# Patient Record
Sex: Male | Born: 1970 | Race: White | Hispanic: No | Marital: Married | Smoking: Current some day smoker
Health system: Southern US, Community
[De-identification: ages and names within clinical notes are randomized; demographics above are authoritative.]

## PROBLEM LIST (undated history)

## (undated) DIAGNOSIS — E785 Hyperlipidemia, unspecified: Secondary | ICD-10-CM

## (undated) DIAGNOSIS — I2510 Atherosclerotic heart disease of native coronary artery without angina pectoris: Secondary | ICD-10-CM

## (undated) DIAGNOSIS — I219 Acute myocardial infarction, unspecified: Secondary | ICD-10-CM

## (undated) DIAGNOSIS — I10 Essential (primary) hypertension: Secondary | ICD-10-CM

## (undated) DIAGNOSIS — G4730 Sleep apnea, unspecified: Secondary | ICD-10-CM

## (undated) DIAGNOSIS — K7689 Other specified diseases of liver: Secondary | ICD-10-CM

## (undated) HISTORY — DX: Hyperlipidemia, unspecified: E78.5

## (undated) HISTORY — DX: Other specified diseases of liver: K76.89

## (undated) HISTORY — PX: CARDIAC CATHETERIZATION: SHX172

## (undated) HISTORY — DX: Atherosclerotic heart disease of native coronary artery without angina pectoris: I25.10

## (undated) HISTORY — PX: ANGIOPLASTY: SHX39

## (undated) HISTORY — DX: Essential (primary) hypertension: I10

## (undated) HISTORY — PX: CORONARY ANGIOPLASTY: SHX604

## (undated) HISTORY — DX: Acute myocardial infarction, unspecified: I21.9

---

## 2003-03-05 ENCOUNTER — Emergency Department (HOSPITAL_COMMUNITY): Admission: EM | Admit: 2003-03-05 | Discharge: 2003-03-05 | Payer: Self-pay | Admitting: Emergency Medicine

## 2003-03-05 ENCOUNTER — Encounter: Payer: Self-pay | Admitting: *Deleted

## 2003-07-15 ENCOUNTER — Emergency Department (HOSPITAL_COMMUNITY): Admission: EM | Admit: 2003-07-15 | Discharge: 2003-07-15 | Payer: Self-pay | Admitting: Emergency Medicine

## 2003-12-25 ENCOUNTER — Emergency Department (HOSPITAL_COMMUNITY): Admission: EM | Admit: 2003-12-25 | Discharge: 2003-12-25 | Payer: Self-pay | Admitting: Emergency Medicine

## 2004-05-21 ENCOUNTER — Emergency Department (HOSPITAL_COMMUNITY): Admission: EM | Admit: 2004-05-21 | Discharge: 2004-05-21 | Payer: Self-pay

## 2004-05-21 ENCOUNTER — Emergency Department (HOSPITAL_COMMUNITY): Admission: EM | Admit: 2004-05-21 | Discharge: 2004-05-21 | Payer: Self-pay | Admitting: Emergency Medicine

## 2004-11-29 ENCOUNTER — Ambulatory Visit: Payer: Self-pay | Admitting: Family Medicine

## 2006-04-01 ENCOUNTER — Emergency Department: Payer: Self-pay | Admitting: Emergency Medicine

## 2006-11-06 ENCOUNTER — Ambulatory Visit: Payer: Self-pay | Admitting: Family Medicine

## 2008-07-01 ENCOUNTER — Emergency Department (HOSPITAL_COMMUNITY): Admission: EM | Admit: 2008-07-01 | Discharge: 2008-07-01 | Payer: Self-pay | Admitting: Emergency Medicine

## 2008-10-13 ENCOUNTER — Ambulatory Visit (HOSPITAL_BASED_OUTPATIENT_CLINIC_OR_DEPARTMENT_OTHER): Admission: RE | Admit: 2008-10-13 | Discharge: 2008-10-13 | Payer: Self-pay | Admitting: Otolaryngology

## 2008-10-18 ENCOUNTER — Ambulatory Visit: Payer: Self-pay | Admitting: Internal Medicine

## 2011-03-29 NOTE — Procedures (Signed)
NAMENARAYAN, SCULL NO.:  [id number]   MEDICAL RECORD NO.:  [id number]          PATIENT TYPE:  OUT   LOCATION:  SLEEP CENTER                 FACILITY:  [facility]   PHYSICIAN:  Taliesin D. Maple Hudson, MD, FCCP, FACPDATE OF BIRTH:  11/18/72   DATE OF STUDY:  10/13/2008                            NOCTURNAL POLYSOMNOGRAM   REFERRING PHYSICIAN:  Hermelinda Medicus, M.D.   REFERRING PHYSICIAN:  Hermelinda Medicus, M.D.   INDICATION FOR STUDY:  Hypersomnia with sleep apnea.   EPWORTH SLEEPINESS SCORE:  Epworth sleepiness score 17/24.  BMI 32.7.  Weight 215 pounds.  Height 68 inches.  Neck 14.5 inches.   MEDICATIONS:  No home medications are listed.   SLEEP ARCHITECTURE:  Total sleep time 369 minutes with sleep efficiency  90.8%.  Stage I was 6.2%.  Stage II 78.9%.  Stage III absent.  REM 14.9%  of total sleep time.  Sleep latency 25 minutes.  REM latency 114  minutes.  Awake after sleep onset 13 minutes.  Arousal index 16.7.  No  bedtime medication was taken.   RESPIRATORY DATA:  Apnea-hypopnea index (AHI) 7.5 per hour.  A total of  46 events was scored, all hypopneas.  Events were more common while  supine, but also present while side sleeping.  REM AHI 27.3 per hour.  This was a diagnostic NPSG protocol with no CPAP titration.   OXYGEN DATA:  Moderately loud snoring with oxygen desaturation to a  nadir of 81%.  Mean oxygen saturation through the study 94.2% on room  air.   CARDIAC DATA:  Normal sinus rhythm with occasional PACs.   MOVEMENT-PARASOMNIA:  No significant movement disturbance.  No rest room  trips.   IMPRESSIONS-RECOMMENDATIONS:  Mild obstructive sleep apnea/hypopnea  syndrome, AHI 7.5 per hour.  Events were more common while supine.  Moderately loud snoring with oxygen desaturation to a nadir of 81%.     Ericson D. Maple Hudson, MD, [facility], FACP  Diplomate, Biomedical engineer of Sleep Medicine  Electronically Signed    CDY/MEDQ  D:  10/18/2008 10:36:41  T:   10/19/2008 00:58:40  Job:  [id number]

## 2017-12-25 DIAGNOSIS — I10 Essential (primary) hypertension: Secondary | ICD-10-CM | POA: Insufficient documentation

## 2018-12-14 ENCOUNTER — Other Ambulatory Visit: Payer: Self-pay | Admitting: Internal Medicine

## 2018-12-14 DIAGNOSIS — I129 Hypertensive chronic kidney disease with stage 1 through stage 4 chronic kidney disease, or unspecified chronic kidney disease: Secondary | ICD-10-CM

## 2018-12-19 ENCOUNTER — Ambulatory Visit
Admission: RE | Admit: 2018-12-19 | Discharge: 2018-12-19 | Disposition: A | Discharge status: Home / Self Care | Payer: BLUE CROSS/BLUE SHIELD | Source: Ambulatory Visit | Attending: Internal Medicine | Admitting: Internal Medicine

## 2018-12-19 DIAGNOSIS — I129 Hypertensive chronic kidney disease with stage 1 through stage 4 chronic kidney disease, or unspecified chronic kidney disease: Secondary | ICD-10-CM

## 2018-12-26 ENCOUNTER — Other Ambulatory Visit: Payer: Self-pay | Admitting: Internal Medicine

## 2018-12-26 DIAGNOSIS — R160 Hepatomegaly, not elsewhere classified: Secondary | ICD-10-CM

## 2018-12-28 ENCOUNTER — Other Ambulatory Visit: Payer: Self-pay | Admitting: Internal Medicine

## 2018-12-28 DIAGNOSIS — T1590XA Foreign body on external eye, part unspecified, unspecified eye, initial encounter: Secondary | ICD-10-CM

## 2019-01-09 ENCOUNTER — Ambulatory Visit
Admission: RE | Admit: 2019-01-09 | Discharge: 2019-01-09 | Disposition: A | Discharge status: Home / Self Care | Payer: BLUE CROSS/BLUE SHIELD | Source: Ambulatory Visit | Attending: Internal Medicine | Admitting: Internal Medicine

## 2019-01-09 DIAGNOSIS — R160 Hepatomegaly, not elsewhere classified: Secondary | ICD-10-CM

## 2019-01-09 DIAGNOSIS — T1590XA Foreign body on external eye, part unspecified, unspecified eye, initial encounter: Secondary | ICD-10-CM

## 2019-01-09 MED ORDER — GADOBENATE DIMEGLUMINE 529 MG/ML IV SOLN
20.0000 mL | Freq: Once | INTRAVENOUS | Status: AC | PRN
  Administered 2019-01-09: 20 mL via INTRAVENOUS

## 2019-01-30 DIAGNOSIS — I25118 Atherosclerotic heart disease of native coronary artery with other forms of angina pectoris: Secondary | ICD-10-CM | POA: Insufficient documentation

## 2019-01-30 DIAGNOSIS — I2510 Atherosclerotic heart disease of native coronary artery without angina pectoris: Secondary | ICD-10-CM | POA: Insufficient documentation

## 2019-04-02 ENCOUNTER — Other Ambulatory Visit: Payer: Self-pay | Admitting: Internal Medicine

## 2019-04-02 DIAGNOSIS — K7689 Other specified diseases of liver: Secondary | ICD-10-CM

## 2019-04-03 ENCOUNTER — Ambulatory Visit
Admission: RE | Admit: 2019-04-03 | Discharge: 2019-04-03 | Disposition: A | Discharge status: Home / Self Care | Payer: BLUE CROSS/BLUE SHIELD | Source: Ambulatory Visit | Attending: Internal Medicine | Admitting: Internal Medicine

## 2019-04-03 DIAGNOSIS — K7689 Other specified diseases of liver: Secondary | ICD-10-CM

## 2019-04-25 ENCOUNTER — Encounter: Payer: Self-pay | Admitting: Cardiology

## 2019-04-26 ENCOUNTER — Other Ambulatory Visit: Payer: Self-pay

## 2019-04-26 ENCOUNTER — Encounter: Payer: Self-pay | Admitting: Cardiology

## 2019-04-26 ENCOUNTER — Ambulatory Visit: Payer: BC Managed Care – PPO | Admitting: Cardiology

## 2019-04-26 VITALS — BP 143/90 | HR 62 | Temp 98.4°F | Ht 68.5 in | Wt 257.6 lb

## 2019-04-26 DIAGNOSIS — E782 Mixed hyperlipidemia: Secondary | ICD-10-CM

## 2019-04-26 DIAGNOSIS — I25811 Atherosclerosis of native coronary artery of transplanted heart without angina pectoris: Secondary | ICD-10-CM

## 2019-04-26 DIAGNOSIS — Z720 Tobacco use: Secondary | ICD-10-CM

## 2019-04-26 DIAGNOSIS — I152 Hypertension secondary to endocrine disorders: Secondary | ICD-10-CM

## 2019-04-26 DIAGNOSIS — I38 Endocarditis, valve unspecified: Secondary | ICD-10-CM | POA: Insufficient documentation

## 2019-04-26 MED ORDER — BUPROPION HCL 100 MG PO TABS: 100.0000 mg | ORAL_TABLET | Freq: Two times a day (BID) | ORAL | 2 refills | Status: DC

## 2019-04-26 NOTE — Progress Notes (Signed)
Patient referred by [name], MD for CAD  Subjective:   Dustin Cook, male    DOB: [birth year], 48 y.o.   MRN: [id number]   Chief Complaint  Patient presents with  . Coronary Artery Disease  . New Patient (Initial Visit)     HPI  48 y.o. 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 He denies any chest pain, shortness of breath with his level of activity.   He had an episode of MI in 2017 while he was working on a project in [employer]. He was reportedly admitted for five days, underwent coronary angiography, but did not need a stent. Since then, he has not had any more episodes of chest pain.   He has had hypertension, workup showing very low renin activity, suspicious for Conn syndrome. He has been referred to an endocrinologist, but has not see one yet. His BP is usually 120s/80s, but elevated today.   Unfortunately, he continues to smoke 1 pack/day. He has tried chantix and nicotine patch in the past, with no benefit. He denies h/o depression, suicidal ideation, or seizures.    Past Medical History:  Diagnosis Date  . Coronary artery disease   . Heart attack ([place])   . HTN (hypertension)   . Hyperlipidemia   . Liver cyst      Past Surgical History:  Procedure Laterality Date  . ANGIOPLASTY    . CARDIAC CATHETERIZATION    . CORONARY ANGIOPLASTY       Social History   Socioeconomic History  . Marital status: Married    Spouse name: Not on file  . Number of children: 5  . Years of education: Not on file  . Highest education level: Not on file  Occupational History  . Not on file  Social Needs  . Financial resource strain: Not on file  . Food insecurity    Worry: Not on file    Inability: Not on file  . Transportation needs    Medical: Not on file    Non-medical: Not on  file  Tobacco Use  . Smoking status: Current Every Day Smoker    Packs/day: 1.00    Types: Cigarettes  . Smokeless tobacco: Never Used  Substance and Sexual Activity  . Alcohol use: Not on file    Comment: SOCIAL  . Drug use: Not Currently  . Sexual activity: Not on file  Lifestyle  . Physical activity    Days per week: Not on file    Minutes per session: Not on file  . Stress: Not on file  Relationships  . Social Herbalist on phone: Not on file    Gets together: Not on file    Attends religious service: Not on file    Active member of club or organization: Not on file    Attends meetings of clubs or organizations: Not on file    Relationship status: Not on file  . Intimate partner violence    Fear of current or ex partner: Not on file    Emotionally abused: Not on file    Physically abused: Not on file    Forced sexual activity: Not on file  Other Topics Concern  . Not on file  Social History Narrative  . Not on file     Family History  Problem  Relation Age of Onset  . Renal Disease Mother   . Kidney disease Mother   . Heart disease Mother   . Heart attack Father   . Heart disease Brother      Current Outpatient Medications on File Prior to Visit  Medication Sig Dispense Refill  . amLODipine (NORVASC) 5 MG tablet daily.    Marland Kitchen aspirin EC 81 MG tablet Take 1 tablet by mouth daily.    Marland Kitchen atorvastatin (LIPITOR) 10 MG tablet Take 1 tablet by mouth daily.    . cloNIDine (CATAPRES) 0.2 MG tablet Take 1 tablet by mouth at bedtime.    . hydrALAZINE (APRESOLINE) 100 MG tablet Take 1 tablet by mouth daily.    Marland Kitchen labetalol (NORMODYNE) 200 MG tablet Take 1 tablet by mouth daily.    Marland Kitchen losartan (COZAAR) 100 MG tablet Take 1 tablet by mouth daily.     No current facility-administered medications on file prior to visit.     Cardiovascular studies:  EKG 04/26/2019:  Sinus rhythm 62 bpm Nonspecific T-abnormality.     Recent labs: 12/14/2018: Glucose 94.  BUN/Cr 13/1.31. eGFR 65. Na/K 143/4.5.  Renin <0.167 (low) Aldosterone 4.2 normal    Review of Systems  Constitution: Negative for decreased appetite, malaise/fatigue, weight gain and weight loss.  HENT: Negative for congestion.   Eyes: Negative for visual disturbance.  Cardiovascular: Negative for chest pain, dyspnea on exertion, leg swelling, palpitations and syncope.  Respiratory: Negative for cough.   Endocrine: Negative for cold intolerance.  Hematologic/Lymphatic: Does not bruise/bleed easily.  Skin: Negative for itching and rash.  Musculoskeletal: Negative for myalgias.  Gastrointestinal: Negative for abdominal pain, nausea and vomiting.  Genitourinary: Negative for dysuria.  Neurological: Negative for dizziness and weakness.  Psychiatric/Behavioral: The patient is not nervous/anxious.   All other systems reviewed and are negative.        Vitals:   04/26/19 1332  BP: (!) 143/90  Pulse: 62  Temp: 98.4 F (36.9 C)  SpO2: 97%     Body mass index is 38.6 kg/m. Filed Weights   04/26/19 1332  Weight: 116.8 kg     Objective:   Physical Exam  Constitutional: He is oriented to person, place, and time. He appears well-developed and well-nourished. No distress.  HENT:  Head: Normocephalic and atraumatic.  Eyes: Pupils are equal, round, and reactive to light. Conjunctivae are normal.  Neck: No JVD present.  Cardiovascular: Normal rate, regular rhythm and intact distal pulses.  Pulmonary/Chest: Effort normal and breath sounds normal. He has no wheezes. He has no rales.  Abdominal: Soft. Bowel sounds are normal. There is no rebound.  Musculoskeletal:        General: No edema.  Lymphadenopathy:    He has no cervical adenopathy.  Neurological: He is alert and oriented to person, place, and time. No cranial nerve deficit.  Skin: Skin is warm and dry.  Psychiatric: He has a normal mood and affect.  Nursing note and vitals reviewed.         Assessment &  Recommendations:   48 y.o. 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   1. Coronary artery disease involving native artery of transplanted heart without angina pectoris No angina symptoms at this time. Continue aggressive risk factor modification and secondary prevention. Continue Aspirin 81 mg daily.  2. Hypertension due to endocrine disorder Generally well controlled, according to the patient. Recommend seeing endocrinologist, given concern for Conn syndrome.   3.  Tobacco abuse Tobacco cessation counseling (CPT [phone]):  - Currently smoking 1 packs/day   - Patient was informed of the dangers of tobacco abuse including stroke, cancer, and MI, as well as benefits of tobacco cessation. - Patient is willing to quit at this time. - Approximately 5 mins were spent counseling patient cessation techniques. We discussed various methods to help quit smoking, including deciding on a date to quit, joining a support group, pharmacological agents- nicotine gum/patch/lozenges, chantix. Patient would like to use Wellbutrin. Started 100 mg bid. - I will reassess his progress at the next follow-up visit   4. Mixed hyperlipidemia Scheduled to undergo lipid panel through PCP. Conitnue Crestor 20 mg for now.     Thank you for referring the patient to [place]. Please feel free to contact with any questions.  [name], MD [facility] [place] Pager: [phone] Office: [phone] If no answer Cell [phone]

## 2019-06-07 ENCOUNTER — Other Ambulatory Visit: Payer: Self-pay

## 2019-06-07 ENCOUNTER — Ambulatory Visit (INDEPENDENT_AMBULATORY_CARE_PROVIDER_SITE_OTHER): Payer: BC Managed Care – PPO

## 2019-06-07 DIAGNOSIS — I152 Hypertension secondary to endocrine disorders: Secondary | ICD-10-CM

## 2019-06-07 DIAGNOSIS — I25811 Atherosclerosis of native coronary artery of transplanted heart without angina pectoris: Secondary | ICD-10-CM | POA: Diagnosis not present

## 2019-06-12 ENCOUNTER — Telehealth: Payer: BC Managed Care – PPO | Admitting: Cardiology

## 2019-06-12 NOTE — Progress Notes (Deleted)
Patient referred by [name], MD for CAD  Subjective:   Dustin Cook, male    DOB: [birth year], 48 y.o.   MRN: [id number]   No chief complaint on file.    HPI  48 y.o. 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   I personally reviewed patient's echocardiogram. I do believe his aortic valve is trileaflet. He has mild to moderate AI. Increased velocity could be due to increased flow from AI. 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   ***  Past Medical History:  Diagnosis Date  . Coronary artery disease   . Heart attack ([place])   . HTN (hypertension)   . Hyperlipidemia   . Liver cyst      Past Surgical History:  Procedure Laterality Date  . ANGIOPLASTY    . CARDIAC CATHETERIZATION    . CORONARY ANGIOPLASTY       Social History   Socioeconomic History  . Marital status: Married    Spouse name: Not on file  . Number of children: 5  . Years of education: Not on file  . Highest education level: Not on file  Occupational History  . Not on file  Social Needs  . Financial resource strain: Not on file  . Food insecurity    Worry: Not on file    Inability: Not on file  . Transportation needs    Medical: Not on file    Non-medical: Not on file  Tobacco Use  . Smoking status: Current Every Day Smoker    Packs/day: 1.00    Types: Cigarettes  . Smokeless tobacco: Never Used  Substance and Sexual Activity  . Alcohol use: Not on file    Comment: SOCIAL  . Drug use: Not Currently  . Sexual activity: Not on file  Lifestyle  . Physical activity    Days per week: Not on file    Minutes per session: Not on file  . Stress: Not on file  Relationships  . Social Herbalist on phone: Not on file    Gets together: Not on file    Attends religious service: Not on file    Active member of club or organization: Not on file    Attends meetings of clubs or organizations:  Not on file    Relationship status: Not on file  . Intimate partner violence    Fear of current or ex partner: Not on file    Emotionally abused: Not on file    Physically abused: Not on file    Forced sexual activity: Not on file  Other Topics Concern  . Not on file  Social History Narrative  . Not on file     Family History  Problem Relation Age of Onset  . Renal Disease Mother   . Kidney disease Mother   . Heart disease Mother   . Heart attack Father   . Heart disease Brother      Current Outpatient Medications on File Prior to Visit  Medication Sig Dispense Refill  . amLODipine (NORVASC) 5 MG tablet daily.    Marland Kitchen aspirin EC 81 MG tablet Take 1 tablet by mouth daily.    Marland Kitchen atorvastatin (LIPITOR) 10 MG tablet Take 1 tablet by mouth daily.    Marland Kitchen buPROPion (WELLBUTRIN) 100 MG tablet Take 1 tablet (100 mg total) by mouth 2 (two) times daily. 60 tablet 2  . cloNIDine (CATAPRES)  0.2 MG tablet Take 1 tablet by mouth at bedtime.    . hydrALAZINE (APRESOLINE) 100 MG tablet Take 1 tablet by mouth daily.    Marland Kitchen labetalol (NORMODYNE) 200 MG tablet Take 1 tablet by mouth daily.    Marland Kitchen losartan (COZAAR) 100 MG tablet Take 1 tablet by mouth daily.     No current facility-administered medications on file prior to visit.     Cardiovascular studies:  Echocardiogram 06/07/2019: 1. Normal LV systolic function with visual EF 60-65%. Left ventricle cavity is normal in size. Moderate concentric hypertrophy of the left ventricle. Normal global wall motion. Doppler evidence of grade I (impaired) diastolic dysfunction. 2. Aortic cusps are not well visualized, can't comment on number of cusps. Mild to moderate aortic regurgitation. Mild aortic valve leaflet thickening. Mildly restricted aortic valve leaflets. Trace aortic valve stenosis. AV Peak Gradient of 17.9 mmHg. 3. 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  EKG 04/26/2019:  Sinus rhythm 62 bpm Nonspecific T-abnormality.      Recent labs: 12/14/2018: Glucose 94. BUN/Cr 13/1.31. eGFR 65. Na/K 143/4.5.  Renin <0.167 (low) Aldosterone 4.2 normal    Review of Systems  Constitution: Negative for decreased appetite, malaise/fatigue, weight gain and weight loss.  HENT: Negative for congestion.   Eyes: Negative for visual disturbance.  Cardiovascular: Negative for chest pain, dyspnea on exertion, leg swelling, palpitations and syncope.  Respiratory: Negative for cough.   Endocrine: Negative for cold intolerance.  Hematologic/Lymphatic: Does not bruise/bleed easily.  Skin: Negative for itching and rash.  Musculoskeletal: Negative for myalgias.  Gastrointestinal: Negative for abdominal pain, nausea and vomiting.  Genitourinary: Negative for dysuria.  Neurological: Negative for dizziness and weakness.  Psychiatric/Behavioral: The patient is not nervous/anxious.   All other systems reviewed and are negative.        There were no vitals filed for this visit.   There is no height or weight on file to calculate BMI. There were no vitals filed for this visit.   Objective:   Physical Exam  Constitutional: He is oriented to person, place, and time. He appears well-developed and well-nourished. No distress.  HENT:  Head: Normocephalic and atraumatic.  Eyes: Pupils are equal, round, and reactive to light. Conjunctivae are normal.  Neck: No JVD present.  Cardiovascular: Normal rate, regular rhythm and intact distal pulses.  Pulmonary/Chest: Effort normal and breath sounds normal. He has no wheezes. He has no rales.  Abdominal: Soft. Bowel sounds are normal. There is no rebound.  Musculoskeletal:        General: No edema.  Lymphadenopathy:    He has no cervical adenopathy.  Neurological: He is alert and oriented to person, place, and time. No cranial nerve deficit.  Skin: Skin is warm and dry.  Psychiatric: He has a normal mood and affect.  Nursing note and vitals reviewed.         Assessment &  Recommendations:   48 y.o. 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   *** 1. Coronary artery disease involving native artery of transplanted heart without angina pectoris No angina symptoms at this time. Continue aggressive risk factor modification and secondary prevention. Continue Aspirin 81 mg daily.  2. Hypertension due to endocrine disorder Generally well controlled, according to the patient. Recommend seeing endocrinologist, given concern for Conn syndrome.   3. Tobacco abuse Tobacco cessation counseling (CPT [phone]):  - Currently smoking 1 packs/day   - Patient was  informed of the dangers of tobacco abuse including stroke, cancer, and MI, as well as benefits of tobacco cessation. - Patient is willing to quit at this time. - Approximately 5 mins were spent counseling patient cessation techniques. We discussed various methods to help quit smoking, including deciding on a date to quit, joining a support group, pharmacological agents- nicotine gum/patch/lozenges, chantix. Patient would like to use Wellbutrin. Started 100 mg bid. - I will reassess his progress at the next follow-up visit   4. Mixed hyperlipidemia Scheduled to undergo lipid panel through PCP. Conitnue Crestor 20 mg for now.     Thank you for referring the patient to [place]. Please feel free to contact with any questions.  [name], MD [facility] [place] Pager: [phone] Office: [phone] If no answer Cell [phone]

## 2019-07-27 DIAGNOSIS — I517 Cardiomegaly: Secondary | ICD-10-CM | POA: Insufficient documentation

## 2019-07-27 DIAGNOSIS — I77810 Thoracic aortic ectasia: Secondary | ICD-10-CM | POA: Insufficient documentation

## 2020-12-29 ENCOUNTER — Encounter: Payer: Self-pay | Admitting: Endocrinology

## 2020-12-29 ENCOUNTER — Other Ambulatory Visit: Payer: Self-pay | Admitting: Gastroenterology

## 2020-12-29 ENCOUNTER — Other Ambulatory Visit: Payer: Self-pay

## 2020-12-29 ENCOUNTER — Telehealth (INDEPENDENT_AMBULATORY_CARE_PROVIDER_SITE_OTHER): Payer: BC Managed Care – PPO | Admitting: Endocrinology

## 2020-12-29 DIAGNOSIS — R7989 Other specified abnormal findings of blood chemistry: Secondary | ICD-10-CM

## 2020-12-29 NOTE — Patient Instructions (Signed)
Blood tests are requested for you today.  We'll let you know about the results.  Please consider with Dr Rory Percy your kidney stone, in view of your abdominal pain.

## 2020-12-29 NOTE — Progress Notes (Signed)
   Subjective:    Patient ID: Dustin Cook, male    DOB: [birth year], 50 y.o.   MRN: [id number]  HPI  telehealth visit today via video visit.  Alternatives to telehealth are presented to this patient, and the patient agrees to the telehealth visit.  Pt is advised of the cost of the visit, and agrees to this, also.   Patient is in his car, and I am at home.   Persons attending the telehealth visit: the patient and I.   Pt is ref by Dr Maia Petties, for elev aldosterone/PRA atio.  This was dx'ed in 2020.  He has no h/o adrenal disease, or cancer.  he does not eat licorice.  No recent change in RUQ pain.  He has gained weight.   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     Review of Systems He denies polyuria, numbness, muscle cramps, n/v, headache, weight change.       Objective:   Physical Exam    outside test results are reviewed (2020): Aldo =4.2 PRA is undetectable  MRI (2020): normal adrenals Renal artery [place]: normal flow  Lab Results  Component Value Date   CREATININE 1.31 12/30/2020   BUN 14 12/30/2020   NA 139 12/30/2020   K 4.1 12/30/2020   CL 107 12/30/2020   CO2 25 12/30/2020     I have reviewed outside records, and summarized: Pt was noted to have elevated also/PRA, and referred here.  He had w/u for secondary HTN.  Wellness and smoking were also addressed.      Assessment & Plan:  elev aldo/PRA ratio.  In the setting of normal aldo, this is of controversial significance.  Patient Instructions  Blood tests are requested for you today.  We'll let you know about the results.  Please consider with Dr Rory Percy your kidney stone, in view of your abdominal pain.

## 2020-12-30 ENCOUNTER — Other Ambulatory Visit: Payer: Self-pay

## 2020-12-30 ENCOUNTER — Ambulatory Visit (INDEPENDENT_AMBULATORY_CARE_PROVIDER_SITE_OTHER): Payer: BC Managed Care – PPO | Admitting: Podiatry

## 2020-12-30 ENCOUNTER — Other Ambulatory Visit: Payer: Self-pay | Admitting: Internal Medicine

## 2020-12-30 ENCOUNTER — Ambulatory Visit (INDEPENDENT_AMBULATORY_CARE_PROVIDER_SITE_OTHER): Payer: BC Managed Care – PPO

## 2020-12-30 ENCOUNTER — Encounter: Payer: Self-pay | Admitting: Podiatry

## 2020-12-30 ENCOUNTER — Other Ambulatory Visit (INDEPENDENT_AMBULATORY_CARE_PROVIDER_SITE_OTHER): Payer: BC Managed Care – PPO

## 2020-12-30 DIAGNOSIS — M79672 Pain in left foot: Secondary | ICD-10-CM | POA: Diagnosis not present

## 2020-12-30 DIAGNOSIS — R7989 Other specified abnormal findings of blood chemistry: Secondary | ICD-10-CM | POA: Diagnosis not present

## 2020-12-30 DIAGNOSIS — R1011 Right upper quadrant pain: Secondary | ICD-10-CM

## 2020-12-30 DIAGNOSIS — M722 Plantar fascial fibromatosis: Secondary | ICD-10-CM

## 2020-12-30 LAB — BASIC METABOLIC PANEL
BUN: 14 mg/dL (ref 6–23)
CO2: 25 mEq/L (ref 19–32)
Calcium: 9.1 mg/dL (ref 8.4–10.5)
Chloride: 107 mEq/L (ref 96–112)
Creatinine, Ser: 1.31 mg/dL (ref 0.40–1.50)
GFR: 64.64 mL/min (ref >60.00)
Glucose, Bld: 87 mg/dL (ref 70–99)
Potassium: 4.1 mEq/L (ref 3.5–5.1)
Sodium: 139 mEq/L (ref 135–145)

## 2020-12-30 LAB — CORTISOL: Cortisol, Plasma: 5.4 ug/dL

## 2020-12-30 MED ORDER — DICLOFENAC SODIUM 75 MG PO TBEC: 75.0000 mg | DELAYED_RELEASE_TABLET | Freq: Two times a day (BID) | ORAL | 2 refills | Status: DC

## 2020-12-30 NOTE — Patient Instructions (Signed)

## 2020-12-31 ENCOUNTER — Other Ambulatory Visit: Payer: Self-pay | Admitting: Podiatry

## 2020-12-31 DIAGNOSIS — M722 Plantar fascial fibromatosis: Secondary | ICD-10-CM

## 2020-12-31 NOTE — Progress Notes (Signed)
Subjective:   Patient ID: Dustin Cook, male   DOB: 50 y.o.   MRN: [id number]   HPI Patient presents stating he has had problems with his left heel for around a year and it is gradually gotten worse and it is worse when he gets up after periods of sitting and now can be achy all day at different times.  Patient does not smoke likes to be active   Review of Systems  All other systems reviewed and are negative.       Objective:  Physical Exam Vitals and nursing note reviewed.  Constitutional:      Appearance: He is well-developed and well-nourished.  Cardiovascular:     Pulses: Intact distal pulses.  Pulmonary:     Effort: Pulmonary effort is normal.  Musculoskeletal:        General: Normal range of motion.  Skin:    General: Skin is warm.  Neurological:     Mental Status: He is alert.     Neurovascular status found to be intact muscle strength was found to be adequate range of motion adequate.  Patient is found to have exquisite discomfort plantar aspect left heel at the insertional point of the tendon into the calcaneus with inflammation fluid around the medial band.  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     Assessment:  Acute plantar fasciitis left inflammation fluid around the medial band     Plan:  H&P reviewed condition discussed orthotic therapy for long-term.  Reviewed orthotics recommended long-term customized devices and at this point will focus on acute pain.  I reviewed his x-rays I did sterile prep injected the left plantar fascia at insertion 3 mg Dexasone Kenalog 5 mg Xylocaine applied fascial brace and gave instructions on stretching exercises support therapy  X-rays indicate spur formation of a small size forming at this time no indication stress fracture arthritis

## 2021-01-05 LAB — ALDOSTERONE + RENIN ACTIVITY W/ RATIO
ALDO / PRA Ratio: 7.1 Ratio (ref 0.9–28.9)
Aldosterone: 4 ng/dL
Renin Activity: 0.56 ng/mL/h (ref 0.25–5.82)

## 2021-01-15 ENCOUNTER — Ambulatory Visit: Payer: BC Managed Care – PPO | Admitting: Podiatry

## 2021-02-01 ENCOUNTER — Other Ambulatory Visit: Payer: BC Managed Care – PPO

## 2021-02-04 ENCOUNTER — Ambulatory Visit: Payer: BC Managed Care – PPO | Admitting: Podiatry

## 2021-03-08 ENCOUNTER — Ambulatory Visit
Admission: RE | Admit: 2021-03-08 | Discharge: 2021-03-08 | Disposition: A | Discharge status: Home / Self Care | Payer: BC Managed Care – PPO | Source: Ambulatory Visit | Attending: Internal Medicine | Admitting: Internal Medicine

## 2021-03-08 ENCOUNTER — Encounter (HOSPITAL_COMMUNITY): Payer: Self-pay | Admitting: Gastroenterology

## 2021-03-08 ENCOUNTER — Other Ambulatory Visit: Payer: Self-pay

## 2021-03-08 DIAGNOSIS — R1011 Right upper quadrant pain: Secondary | ICD-10-CM

## 2021-03-10 ENCOUNTER — Other Ambulatory Visit (HOSPITAL_COMMUNITY)
Admission: RE | Admit: 2021-03-10 | Discharge: 2021-03-10 | Disposition: A | Discharge status: Home / Self Care | Payer: BC Managed Care – PPO | Source: Ambulatory Visit | Attending: Gastroenterology | Admitting: Gastroenterology

## 2021-03-10 DIAGNOSIS — D122 Benign neoplasm of ascending colon: Secondary | ICD-10-CM | POA: Diagnosis not present

## 2021-03-10 DIAGNOSIS — K5730 Diverticulosis of large intestine without perforation or abscess without bleeding: Secondary | ICD-10-CM | POA: Diagnosis not present

## 2021-03-10 DIAGNOSIS — Z20822 Contact with and (suspected) exposure to covid-19: Secondary | ICD-10-CM | POA: Insufficient documentation

## 2021-03-10 DIAGNOSIS — Z87891 Personal history of nicotine dependence: Secondary | ICD-10-CM | POA: Diagnosis not present

## 2021-03-10 DIAGNOSIS — Z01812 Encounter for preprocedural laboratory examination: Secondary | ICD-10-CM | POA: Insufficient documentation

## 2021-03-10 DIAGNOSIS — K635 Polyp of colon: Secondary | ICD-10-CM | POA: Diagnosis not present

## 2021-03-10 DIAGNOSIS — Z1211 Encounter for screening for malignant neoplasm of colon: Secondary | ICD-10-CM | POA: Diagnosis not present

## 2021-03-10 LAB — SARS CORONAVIRUS 2 (TAT 6-24 HRS): SARS Coronavirus 2: NEGATIVE

## 2021-03-11 ENCOUNTER — Encounter (HOSPITAL_COMMUNITY): Payer: Self-pay | Admitting: Gastroenterology

## 2021-03-12 ENCOUNTER — Ambulatory Visit (HOSPITAL_COMMUNITY)
Admission: RE | Admit: 2021-03-12 | Discharge: 2021-03-12 | Disposition: A | Discharge status: Home / Self Care | Payer: BC Managed Care – PPO | Attending: Gastroenterology | Admitting: Gastroenterology

## 2021-03-12 ENCOUNTER — Encounter (HOSPITAL_COMMUNITY): Payer: Self-pay | Admitting: Gastroenterology

## 2021-03-12 ENCOUNTER — Encounter (HOSPITAL_COMMUNITY)
Admission: RE | Disposition: A | Discharge status: Home / Self Care | Payer: Self-pay | Source: Home / Self Care | Attending: Gastroenterology

## 2021-03-12 ENCOUNTER — Other Ambulatory Visit: Payer: Self-pay

## 2021-03-12 ENCOUNTER — Ambulatory Visit (HOSPITAL_COMMUNITY): Payer: BC Managed Care – PPO | Admitting: Anesthesiology

## 2021-03-12 DIAGNOSIS — Z1211 Encounter for screening for malignant neoplasm of colon: Secondary | ICD-10-CM | POA: Insufficient documentation

## 2021-03-12 DIAGNOSIS — Z87891 Personal history of nicotine dependence: Secondary | ICD-10-CM | POA: Insufficient documentation

## 2021-03-12 DIAGNOSIS — Z20822 Contact with and (suspected) exposure to covid-19: Secondary | ICD-10-CM | POA: Insufficient documentation

## 2021-03-12 DIAGNOSIS — D122 Benign neoplasm of ascending colon: Secondary | ICD-10-CM | POA: Insufficient documentation

## 2021-03-12 DIAGNOSIS — K5730 Diverticulosis of large intestine without perforation or abscess without bleeding: Secondary | ICD-10-CM | POA: Insufficient documentation

## 2021-03-12 DIAGNOSIS — K635 Polyp of colon: Secondary | ICD-10-CM | POA: Insufficient documentation

## 2021-03-12 HISTORY — PX: COLONOSCOPY WITH PROPOFOL: SHX5780

## 2021-03-12 HISTORY — DX: Sleep apnea, unspecified: G47.30

## 2021-03-12 HISTORY — PX: POLYPECTOMY: SHX5525

## 2021-03-12 SURGERY — COLONOSCOPY WITH PROPOFOL
Anesthesia: Monitor Anesthesia Care

## 2021-03-12 MED ORDER — HYDRALAZINE HCL 20 MG/ML IJ SOLN
10.0000 mg | Freq: Once | INTRAMUSCULAR | Status: AC
  Administered 2021-03-12: 10 mg via INTRAVENOUS

## 2021-03-12 MED ORDER — SODIUM CHLORIDE 0.9 % IV SOLN: INTRAVENOUS | Status: DC

## 2021-03-12 MED ORDER — DEXMEDETOMIDINE (PRECEDEX) IN NS 20 MCG/5ML (4 MCG/ML) IV SYRINGE
PREFILLED_SYRINGE | INTRAVENOUS | Status: DC | PRN
  Administered 2021-03-12: 12 ug via INTRAVENOUS

## 2021-03-12 MED ORDER — DEXMEDETOMIDINE (PRECEDEX) IN NS 20 MCG/5ML (4 MCG/ML) IV SYRINGE
PREFILLED_SYRINGE | INTRAVENOUS | Status: AC
  Filled 2021-03-12: qty 5

## 2021-03-12 MED ORDER — PROPOFOL 10 MG/ML IV BOLUS
INTRAVENOUS | Status: DC | PRN
  Administered 2021-03-12: 20 mg via INTRAVENOUS

## 2021-03-12 MED ORDER — LIDOCAINE 2% (20 MG/ML) 5 ML SYRINGE
INTRAMUSCULAR | Status: DC | PRN
  Administered 2021-03-12: 60 mg via INTRAVENOUS

## 2021-03-12 MED ORDER — HYDRALAZINE HCL 20 MG/ML IJ SOLN
INTRAMUSCULAR | Status: AC
  Filled 2021-03-12: qty 1

## 2021-03-12 MED ORDER — PROPOFOL 500 MG/50ML IV EMUL
INTRAVENOUS | Status: AC
  Filled 2021-03-12: qty 50

## 2021-03-12 MED ORDER — PROPOFOL 500 MG/50ML IV EMUL
INTRAVENOUS | Status: DC | PRN
  Administered 2021-03-12: 200 ug/kg/min via INTRAVENOUS

## 2021-03-12 SURGICAL SUPPLY — 22 items

## 2021-03-12 NOTE — Anesthesia Procedure Notes (Signed)
Procedure Name: MAC Date/Time: 03/12/2021 10:04 AM Performed by: Lieutenant Diego, CRNA Pre-anesthesia Checklist: Patient identified, Emergency Drugs available, Suction available, Patient being monitored and Timeout performed Patient Re-evaluated:Patient Re-evaluated prior to induction Oxygen Delivery Method: Simple face mask Preoxygenation: Pre-oxygenation with 100% oxygen Induction Type: IV induction

## 2021-03-12 NOTE — Transfer of Care (Signed)
Immediate Anesthesia Transfer of Care Note  Patient: Dustin Cook  Procedure(s) Performed: COLONOSCOPY WITH PROPOFOL (N/A ) POLYPECTOMY  Patient Location: Endoscopy Unit  Anesthesia Type:MAC  Level of Consciousness: awake  Airway & Oxygen Therapy: Patient Spontanous Breathing and Patient connected to face mask oxygen  Post-op Assessment: Report given to RN and Post -op Vital signs reviewed and stable  Post vital signs: Reviewed and stable  Last Vitals:  Vitals Value Taken Time  BP 123/63 03/12/21 1030  Temp    Pulse 59 03/12/21 1030  Resp 19 03/12/21 1030  SpO2 97 % 03/12/21 1030  Vitals shown include unvalidated device data.  Last Pain:  Vitals:   03/12/21 0901  TempSrc: Oral  PainSc: 0-No pain         Complications: No complications documented.

## 2021-03-12 NOTE — Op Note (Signed)
[facility] Patient Name: Dustin Cook Procedure Date: 03/12/2021 MRN: [id number] Attending MD: Carol Ada , MD Date of Birth: 01/10/1971 CSN: [id number] Age: 50 Admit Type: Outpatient Procedure:                Colonoscopy Indications:              Screening for colorectal malignant neoplasm Providers:                Carol Ada, MD, Kary Kos RN, RN, Fransico Setters                            Mbumina, Technician Referring MD:              Medicines:                Propofol per Anesthesia Complications:            No immediate complications. Estimated Blood Loss:     Estimated blood loss: none. Procedure:                Pre-Anesthesia Assessment:                           - Prior to the procedure, a History and Physical                            was performed, and patient medications and                            allergies were reviewed. The patient's tolerance of                            previous anesthesia was also reviewed. The risks                            and benefits of the procedure and the sedation                            options and risks were discussed with the patient.                            All questions were answered, and informed consent                            was obtained. Prior Anticoagulants: The patient has                            taken no previous anticoagulant or antiplatelet                            agents. ASA Grade Assessment: III - A patient with                            severe systemic disease. After reviewing the risks  and benefits, the patient was deemed in                            satisfactory condition to undergo the procedure.                           - Sedation was administered by an anesthesia                            professional. Deep sedation was attained.                           After obtaining informed consent, the colonoscope                            was passed under  direct vision. Throughout the                            procedure, the patient's blood pressure, pulse, and                            oxygen saturations were monitored continuously. The                            [id number] Olympus colonoscope was                            introduced through the anus and advanced to the the                            cecum, identified by appendiceal orifice and                            ileocecal valve. The colonoscopy was performed                            without difficulty. The patient tolerated the                            procedure well. The quality of the bowel                            preparation was good. The ileocecal valve,                            appendiceal orifice, and rectum were photographed. Scope In: 10:10:43 AM Scope Out: 10:25:40 AM Scope Withdrawal Time: 0 hours 10 minutes 25 seconds  Total Procedure Duration: 0 hours 14 minutes 57 seconds  Findings:      Two sessile polyps were found in the recto-sigmoid colon and ascending       colon. The polyps were 3 to 4 mm in size. These polyps were removed with       a cold snare. Resection and retrieval were complete.      Scattered small and large-mouthed diverticula were found in the  sigmoid       colon and descending colon. Impression:               - Two 3 to 4 mm polyps at the recto-sigmoid colon                            and in the ascending colon, removed with a cold                            snare. Resected and retrieved.                           - Diverticulosis in the sigmoid colon and in the                            descending colon. Moderate Sedation:      Not Applicable - Patient had care per Anesthesia. Recommendation:           - Patient has a contact number available for                            emergencies. The signs and symptoms of potential                            delayed complications were discussed with the                             patient. Return to normal activities tomorrow.                            Written discharge instructions were provided to the                            patient.                           - Resume previous diet.                           - Continue present medications.                           - Await pathology results.                           - Repeat colonoscopy in 7 years for surveillance. Procedure Code(s):        --- Professional ---                           [phone], Colonoscopy, flexible; with removal of                            tumor(s), polyp(s), or other lesion(s) by snare                            technique Diagnosis Code(s):        ---  Professional ---                           Z12.11, Encounter for screening for malignant                            neoplasm of colon                           K63.5, Polyp of colon                           K57.30, Diverticulosis of large intestine without                            perforation or abscess without bleeding CPT copyright 2019 American Medical Association. All rights reserved. The codes documented in this report are preliminary and upon coder review may  be revised to meet current compliance requirements. Carol Ada, MD Carol Ada, MD 03/12/2021 10:38:31 AM This report has been signed electronically. Number of Addenda: 0

## 2021-03-12 NOTE — H&P (Signed)
  Dustin Cook HPI: This 50 year old white male presents to the office for colorectal cancer screening. He has 1-2 BM's per day with no obvious blood or mucus in the stool. He has occasional acid reflux when he lays down immediately after meals. He has an Alkaline phosphatase of 132 on 11/03/2020. He has good appetite and his weight has been stable. He denies having any complaints of abdominal pain, nausea, vomiting, dysphagia or odynophagia. He denies having a family history of colon cancer, celiac sprue or IBD.  Past Medical History:  Diagnosis Date  . Coronary artery disease   . Heart attack ([place])   . HTN (hypertension)   . Hyperlipidemia   . Liver cyst   . Sleep apnea     Past Surgical History:  Procedure Laterality Date  . ANGIOPLASTY    . CARDIAC CATHETERIZATION    . CORONARY ANGIOPLASTY      Family History  Problem Relation Age of Onset  . Renal Disease Mother   . Kidney disease Mother   . Heart disease Mother   . Heart attack Father   . Heart disease Brother     Social History:  reports that he has quit smoking. His smoking use included cigarettes. He smoked 1.00 pack per day. He has never used smokeless tobacco. He reports previous drug use. No history on file for alcohol use.  Allergies: No Known Allergies  Medications:  Scheduled:  Continuous: . sodium chloride      No results found for this or any previous visit (from the past 24 hour(s)).   No results found.  ROS:  As stated above in the HPI otherwise negative.  Blood pressure (!) 152/84, pulse (!) 59, temperature (!) 97.5 F (36.4 C), temperature source Oral, resp. rate (!) 23, height 5\' 8"  (1.727 m), weight 121.1 kg, SpO2 98 %.    PE: Gen: NAD, Alert and Oriented HEENT:  Dustin Cook/AT, EOMI Neck: Supple, no LAD Lungs: CTA Bilaterally CV: RRR without M/G/R ABD: Soft, NTND, +BS Ext: No C/C/E  Assessment/Plan: 1) Screening colonoscopy.  Dustin Cook D 03/12/2021, 9:22 AM

## 2021-03-12 NOTE — Discharge Instructions (Addendum)
YOU HAD AN ENDOSCOPIC PROCEDURE TODAY: Refer to the procedure report and other information in the discharge instructions given to you for any specific questions about what was found during the examination. If this information does not answer your questions, please call [facility] office at [phone] to clarify.   YOU SHOULD EXPECT: Some feelings of bloating in the abdomen. Passage of more gas than usual. Walking can help get rid of the air that was put into your GI tract during the procedure and reduce the bloating. If you had a lower endoscopy (such as a colonoscopy or flexible sigmoidoscopy) you may notice spotting of blood in your stool or on the toilet paper. Some abdominal soreness may be present for a day or two, also.  DIET: Your first meal following the procedure should be a light meal and then it is ok to progress to your normal diet. A half-sandwich or bowl of soup is an example of a good first meal. Heavy or fried foods are harder to digest and may make you feel nauseous or bloated. Drink plenty of fluids but you should avoid alcoholic beverages for 24 hours. If you had a esophageal dilation, please see attached instructions for diet.    ACTIVITY: Your care partner should take you home directly after the procedure. You should plan to take it easy, moving slowly for the rest of the day. You can resume normal activity the day after the procedure however YOU SHOULD NOT DRIVE, use power tools, machinery or perform tasks that involve climbing or major physical exertion for 24 hours (because of the sedation medicines used during the test).   SYMPTOMS TO REPORT IMMEDIATELY: A gastroenterologist can be reached at any hour. Please call [phone]  for any of the following symptoms:  Following lower endoscopy (colonoscopy, flexible sigmoidoscopy) Excessive amounts of blood in the stool  Significant tenderness, worsening of abdominal pains  Swelling of the abdomen that is new, acute  Fever of 100  or higher    FOLLOW UP:  If any biopsies were taken you will be contacted by phone or by letter within the next 1-3 weeks. Call [phone]  if you have not heard about the biopsies in 3 weeks.  Please also call with any specific questions about appointments or follow up tests. YOU HAD AN ENDOSCOPIC PROCEDURE TODAY: Refer to the procedure report and other information in the discharge instructions given to you for any specific questions about what was found during the examination. If this information does not answer your questions, please call [facility] office at [phone] to clarify.   YOU SHOULD EXPECT: Some feelings of bloating in the abdomen. Passage of more gas than usual. Walking can help get rid of the air that was put into your GI tract during the procedure and reduce the bloating. If you had a lower endoscopy (such as a colonoscopy or flexible sigmoidoscopy) you may notice spotting of blood in your stool or on the toilet paper. Some abdominal soreness may be present for a day or two, also.  DIET: Your first meal following the procedure should be a light meal and then it is ok to progress to your normal diet. A half-sandwich or bowl of soup is an example of a good first meal. Heavy or fried foods are harder to digest and may make you feel nauseous or bloated. Drink plenty of fluids but you should avoid alcoholic beverages for 24 hours. If you had a esophageal dilation, please see attached instructions for diet.      ACTIVITY: Your care partner should take you home directly after the procedure. You should plan to take it easy, moving slowly for the rest of the day. You can resume normal activity the day after the procedure however YOU SHOULD NOT DRIVE, use power tools, machinery or perform tasks that involve climbing or major physical exertion for 24 hours (because of the sedation medicines used during the test).   SYMPTOMS TO REPORT IMMEDIATELY: A gastroenterologist can be reached at any hour.  Please call [phone]  for any of the following symptoms:  Following lower endoscopy (colonoscopy, flexible sigmoidoscopy) Excessive amounts of blood in the stool  Significant tenderness, worsening of abdominal pains  Swelling of the abdomen that is new, acute  Fever of 100 or higher    FOLLOW UP:  If any biopsies were taken you will be contacted by phone or by letter within the next 1-3 weeks. Call [phone]  if you have not heard about the biopsies in 3 weeks.  Please also call with any specific questions about appointments or follow up tests.  

## 2021-03-12 NOTE — Anesthesia Preprocedure Evaluation (Addendum)
Anesthesia Evaluation  Patient identified by MRN, date of birth, ID band Patient awake    Reviewed: Allergy & Precautions, NPO status , Patient's Chart, lab work & pertinent test results  History of Anesthesia Complications Negative for: history of anesthetic complications  Airway Mallampati: III  TM Distance: >3 FB Neck ROM: Full    Dental  (+) Dental Advisory Given, Teeth Intact, Chipped,    Pulmonary sleep apnea and Continuous Positive Airway Pressure Ventilation , former smoker,  Covid-19 Nucleic Acid Test Results Lab Results      Component                Value               Date                      SARSCOV2NAA              NEGATIVE            03/10/2021              breath sounds clear to auscultation       Cardiovascular hypertension, Pt. on medications and Pt. on home beta blockers (-) angina+ CAD and + Past MI  (-) Cardiac Stents and (-) CHF  Rhythm:Regular  2020: CONCLUSIONS:  1. Normal myocardial perfusion study without evidence of inducible ischemia or prior infarction.   2. Normal left ventricular wall motion and segmental function.   3. The post stress ejection fraction is measured at 52%.      Neuro/Psych negative neurological ROS  negative psych ROS   GI/Hepatic negative GI ROS, Neg liver ROS,   Endo/Other  negative endocrine ROS  Renal/GU Renal diseaseLab Results      Component                Value               Date                      CREATININE               1.31                12/30/2020                Musculoskeletal negative musculoskeletal ROS (+)   Abdominal   Peds  Hematology negative hematology ROS (+) No results found for: WBC, HGB, HCT, MCV, PLT    Anesthesia Other Findings Poorly controlled htn  Reproductive/Obstetrics                            Anesthesia Physical Anesthesia Plan  ASA: III  Anesthesia Plan: MAC   Post-op Pain Management:     Induction: Intravenous  PONV Risk Score and Plan: 1 and Propofol infusion and Treatment may vary due to age or medical condition  Airway Management Planned: Nasal Cannula  Additional Equipment:   Intra-op Plan:   Post-operative Plan:   Informed Consent: I have reviewed the patients History and Physical, chart, labs and discussed the procedure including the risks, benefits and alternatives for the proposed anesthesia with the patient or authorized representative who has indicated his/her understanding and acceptance.     Dental advisory given  Plan Discussed with: CRNA  Anesthesia Plan Comments:         Anesthesia Quick Evaluation

## 2021-03-15 ENCOUNTER — Encounter (HOSPITAL_COMMUNITY): Payer: Self-pay | Admitting: Gastroenterology

## 2021-03-15 LAB — SURGICAL PATHOLOGY

## 2021-03-16 NOTE — Anesthesia Postprocedure Evaluation (Signed)
Anesthesia Post Note  Patient: Dustin Cook  Procedure(s) Performed: COLONOSCOPY WITH PROPOFOL (N/A ) POLYPECTOMY     Patient location during evaluation: Endoscopy Anesthesia Type: MAC Level of consciousness: awake and alert Pain management: pain level controlled Vital Signs Assessment: post-procedure vital signs reviewed and stable Respiratory status: spontaneous breathing, nonlabored ventilation, respiratory function stable and patient connected to nasal cannula oxygen Cardiovascular status: blood pressure returned to baseline and stable Postop Assessment: no apparent nausea or vomiting Anesthetic complications: no   No complications documented.  Last Vitals:  Vitals:   03/12/21 1040 03/12/21 1050  BP: 131/71 135/82  Pulse: 62 61  Resp: (!) 23 14  Temp:    SpO2: 99% 98%    Last Pain:  Vitals:   03/12/21 1050  TempSrc:   PainSc: 0-No pain                 Breahna Boylen

## 2021-06-04 ENCOUNTER — Ambulatory Visit: Payer: BC Managed Care – PPO | Admitting: Cardiology

## 2021-06-04 ENCOUNTER — Other Ambulatory Visit: Payer: Self-pay

## 2021-06-04 ENCOUNTER — Encounter: Payer: Self-pay | Admitting: Cardiology

## 2021-06-04 VITALS — BP 131/82 | HR 75 | Temp 98.0°F | Resp 17 | Ht 68.0 in | Wt 269.0 lb

## 2021-06-04 DIAGNOSIS — I77810 Thoracic aortic ectasia: Secondary | ICD-10-CM

## 2021-06-04 DIAGNOSIS — I10 Essential (primary) hypertension: Secondary | ICD-10-CM

## 2021-06-04 DIAGNOSIS — I2510 Atherosclerotic heart disease of native coronary artery without angina pectoris: Secondary | ICD-10-CM

## 2021-06-04 NOTE — Progress Notes (Signed)
Patient referred by [name], MD for CAD  Subjective:   Dustin Cook, male    DOB: [birth year], 50 y.o.   MRN: [id number]   Chief Complaint  Patient presents with   Coronary artery disease involving native artery of transpla   aortic root dilation   Hypertension     HPI  50 y.o. Caucasian male with  hypertension, hyperlipidemia, CAD s/p MI in 2017, mild aortic root dilatation   Patient was last seen by me in 2020.  He is re- referred to follow-up on his aortic regurgitation.    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 He denies any chest pain, shortness of breath with his level of activity.   Prior history: He had an episode of MI in 2017 while he was working on a project in [employer]. He was reportedly admitted for five days, underwent coronary angiography, but did not need a stent. Since then, he has not had any more episodes of chest pain.     Current Outpatient Medications on File Prior to Visit  Medication Sig Dispense Refill   amLODipine (NORVASC) 10 MG tablet Take 10 mg by mouth daily.     aspirin EC 81 MG tablet Take 81 mg by mouth daily.     Calcium-Magnesium (CAL-MAG PO) Take 1 tablet by mouth daily as needed (cramps).     cloNIDine (CATAPRES) 0.2 MG tablet Take 0.2 mg by mouth at bedtime.     labetalol (NORMODYNE) 200 MG tablet Take 200 mg by mouth 2 (two) times daily.     losartan (COZAAR) 100 MG tablet Take 100 mg by mouth daily.     Potassium 99 MG TABS Take 99 mg by mouth daily as needed (cramps).     rosuvastatin (CRESTOR) 40 MG tablet Take 40 mg by mouth daily.     No current facility-administered medications on file prior to visit.    Cardiovascular studies:  EKG 06/04/2021: Sinus rhythm 72 bpm  Normal EKG  Echocardiogram 06/07/2019: 1. Normal LV systolic function with visual EF 60-65%.  Left ventricle cavity is normal in size. Moderate  concentric hypertrophy of the left  ventricle. Normal global  wall motion. Doppler evidence of grade I (impaired)  diastolic dysfunction.  2. Aortic cusps are not well visualized, can't comment on  number of cusps. Mild to moderate aortic regurgitation.  Mild aortic valve leaflet thickening. Mildly restricted aortic  valve leaflets. Trace aortic valve stenosis. AV Peak  Gradient of 17.9 mmHg.  3. 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  Recent labs: 12/30/2020: Glucose 87, BUN/Cr 14/1.31. EGFR 64. Na/K 139/4.1.   12/14/2018: Glucose 94. BUN/Cr 13/1.31. eGFR 65. Na/K 143/4.5.  Renin <0.167 (low) Aldosterone 4.2 normal    Review of Systems  Cardiovascular:  Negative for chest pain, dyspnea on exertion, leg swelling, palpitations and syncope.        Vitals:   06/04/21 1007  BP: 131/82  Pulse: 75  Resp: 17  Temp: 98 F (36.7 C)  SpO2: 97%     Body mass index is 40.9 kg/m. Filed Weights   06/04/21 1007  Weight: 269 lb (122 kg)     Objective:   Physical Exam Vitals and nursing note reviewed.  Constitutional:      General: He is not in acute distress. Neck:     Vascular: No JVD.  Cardiovascular:     Rate and Rhythm: Normal rate and regular rhythm.  Heart sounds: Normal heart sounds. No murmur heard. Pulmonary:     Effort: Pulmonary effort is normal.     Breath sounds: Normal breath sounds. No wheezing or rales.  Musculoskeletal:     Right lower leg: No edema.     Left lower leg: No edema.          Assessment & Recommendations:    50 y.o. Caucasian male with  hypertension, hyperlipidemia, CAD s/p MI in 2017, mild aortic root dilatation   Coronary artery disease involving native artery of native heart without angina pectoris: No angina symptoms at this time. Continue aggressive risk factor modification and secondary prevention. Continue Aspirin 81 mg daily, Crestor 40 mg daily. Check lipid panel.  Aortic root dilatation: 4.2 cm on echocardiogram in 2020.  Repeat  echocardiogram.  Hypertension: Controlled  Mixed hyperlipidemia: As above  F/u after tests  [name], MD [facility] Pager: [phone] Office: [phone] If no answer Cell [phone]

## 2021-06-26 LAB — LIPID PANEL
Chol/HDL Ratio: 4 ratio (ref 0.0–5.0)
Cholesterol, Total: 175 mg/dL (ref 100–199)
HDL: 44 mg/dL (ref >39)
LDL Chol Calc (NIH): 112 mg/dL — ABNORMAL HIGH (ref 0–99)
Triglycerides: 105 mg/dL (ref 0–149)
VLDL Cholesterol Cal: 19 mg/dL (ref 5–40)

## 2021-06-28 ENCOUNTER — Ambulatory Visit: Payer: BC Managed Care – PPO

## 2021-06-28 ENCOUNTER — Other Ambulatory Visit: Payer: Self-pay

## 2021-06-28 DIAGNOSIS — I2510 Atherosclerotic heart disease of native coronary artery without angina pectoris: Secondary | ICD-10-CM

## 2021-06-28 DIAGNOSIS — I10 Essential (primary) hypertension: Secondary | ICD-10-CM

## 2021-07-09 ENCOUNTER — Ambulatory Visit: Payer: BC Managed Care – PPO | Admitting: Cardiology

## 2021-07-09 NOTE — Progress Notes (Signed)
No show

## 2021-08-09 ENCOUNTER — Ambulatory Visit: Payer: BC Managed Care – PPO | Admitting: Cardiology

## 2021-08-09 NOTE — Progress Notes (Deleted)
Patient referred by [name], MD for CAD  Subjective:   Dustin Cook, male    DOB: [birth year], 50 y.o.   MRN: [id number]   No chief complaint on file.    HPI  50 y.o. Caucasian male with  hypertension, hyperlipidemia, CAD s/p MI in 2017, mild aortic root dilatation   Patient was last seen by me in 2020.  He is re- referred to follow-up on his aortic regurgitation.    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 He denies any chest pain, shortness of breath with his level of activity.   Prior history: He had an episode of MI in 2017 while he was working on a project in [employer]. He was reportedly admitted for five days, underwent coronary angiography, but did not need a stent. Since then, he has not had any more episodes of chest pain.     Current Outpatient Medications on File Prior to Visit  Medication Sig Dispense Refill   amLODipine (NORVASC) 10 MG tablet Take 10 mg by mouth daily.     aspirin EC 81 MG tablet Take 81 mg by mouth daily.     Calcium-Magnesium (CAL-MAG PO) Take 1 tablet by mouth daily as needed (cramps).     cloNIDine (CATAPRES) 0.2 MG tablet Take 0.2 mg by mouth at bedtime.     diclofenac (VOLTAREN) 75 MG EC tablet Take 75 mg by mouth 2 (two) times daily as needed.     labetalol (NORMODYNE) 200 MG tablet Take 200 mg by mouth 2 (two) times daily.     losartan (COZAAR) 100 MG tablet Take 100 mg by mouth daily.     Potassium 99 MG TABS Take 99 mg by mouth daily as needed (cramps).     rosuvastatin (CRESTOR) 40 MG tablet Take 40 mg by mouth daily.     No current facility-administered medications on file prior to visit.    Cardiovascular studies:  Echocardiogram 06/28/2021:  Left ventricle cavity is normal in size. Moderate concentric hypertrophy  of the left ventricle. Normal global wall motion. Normal LV systolic  function with EF 68%. Doppler evidence of grade I (impaired) diastolic   dysfunction, normal LAP.  Trileaflet aortic valve with mild aortic valve leaflet calcification.  Moderate (Grade II) aortic regurgitation.  Mild tricuspid regurgitation.  No evidence of pulmonary hypertension.  Compared to previous study in 2020, aortic root dilatation not appreciated  on this study. Proximal ascending aorta is not well visualized. Consider  CTA, if clinically indicated.   EKG 06/04/2021: Sinus rhythm 72 bpm  Normal EKG  Echocardiogram 06/07/2019: 1. Normal LV systolic function with visual EF 60-65%.  Left ventricle cavity is normal in size. Moderate  concentric hypertrophy of the left ventricle. Normal global  wall motion. Doppler evidence of grade I (impaired)  diastolic dysfunction.  2. Aortic cusps are not well visualized, can't comment on  number of cusps. Mild to moderate aortic regurgitation.  Mild aortic valve leaflet thickening. Mildly restricted aortic  valve leaflets. Trace aortic valve stenosis. AV Peak  Gradient of 17.9 mmHg.  3. 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  Recent labs: 12/30/2020: Glucose 87, BUN/Cr 14/1.31. EGFR 64. Na/K 139/4.1.   12/14/2018: Glucose 94. BUN/Cr 13/1.31. eGFR 65. Na/K 143/4.5.  Renin <0.167 (low) Aldosterone 4.2 normal    Review of Systems  Cardiovascular:  Negative for chest pain, dyspnea on exertion, leg swelling, palpitations and syncope.  There were no vitals filed for this visit.    There is no height or weight on file to calculate BMI. There were no vitals filed for this visit.    Objective:   Physical Exam Vitals and nursing note reviewed.  Constitutional:      General: He is not in acute distress. Neck:     Vascular: No JVD.  Cardiovascular:     Rate and Rhythm: Normal rate and regular rhythm.     Heart sounds: Normal heart sounds. No murmur heard. Pulmonary:     Effort: Pulmonary effort is normal.     Breath sounds: Normal breath sounds. No wheezing or rales.   Musculoskeletal:     Right lower leg: No edema.     Left lower leg: No edema.          Assessment & Recommendations:    50 y.o. Caucasian male with  hypertension, hyperlipidemia, CAD s/p MI in 2017, mild aortic root dilatation   Coronary artery disease involving native artery of native heart without angina pectoris: No angina symptoms at this time. Continue aggressive risk factor modification and secondary prevention. Continue Aspirin 81 mg daily, Crestor 40 mg daily. Check lipid panel.  Aortic root dilatation: 4.2 cm on echocardiogram in 2020.  Repeat echocardiogram.  Hypertension: Controlled  Mixed hyperlipidemia: As above  *** F/u after tests  Dustin Mormon, MD [facility] Pager: [phone] Office: [phone] If no answer Cell [phone]

## 2022-07-18 IMAGING — US US ABDOMEN LIMITED RUQ/ASCITES
1 series · 14 of 25 positions shown · non-contrast
Comparison: 04/03/2019, MRI 01/09/2019

CLINICAL DATA: Right upper quadrant abdominal pain

EXAM:
ULTRASOUND ABDOMEN LIMITED RIGHT UPPER QUADRANT

[Series 1: us abdomen limited ruq/ascites · 0.28mm/px · 14 of 41 slices shown]
[im 1/41]
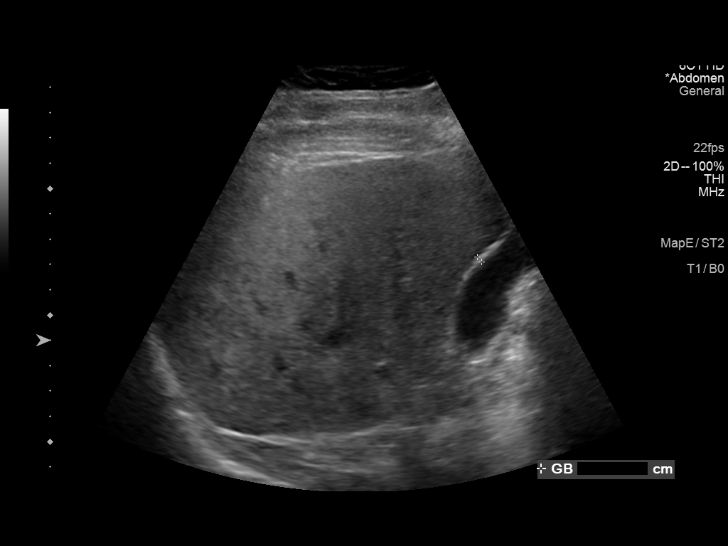
[im 4/41]
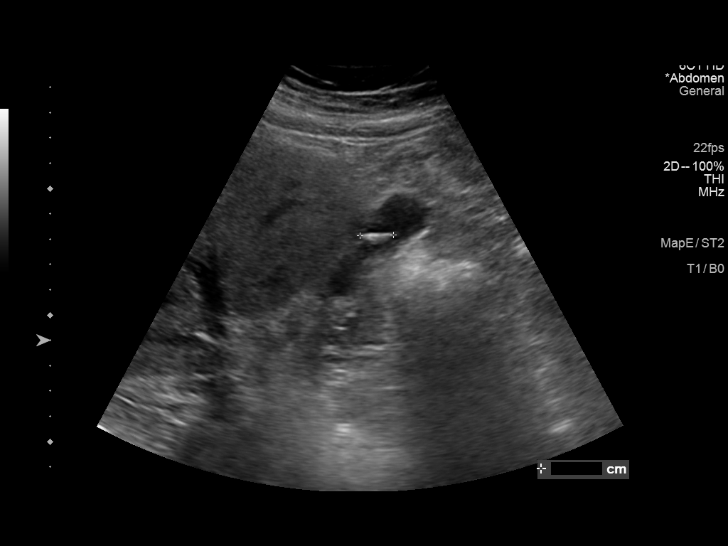
[im 7/41]
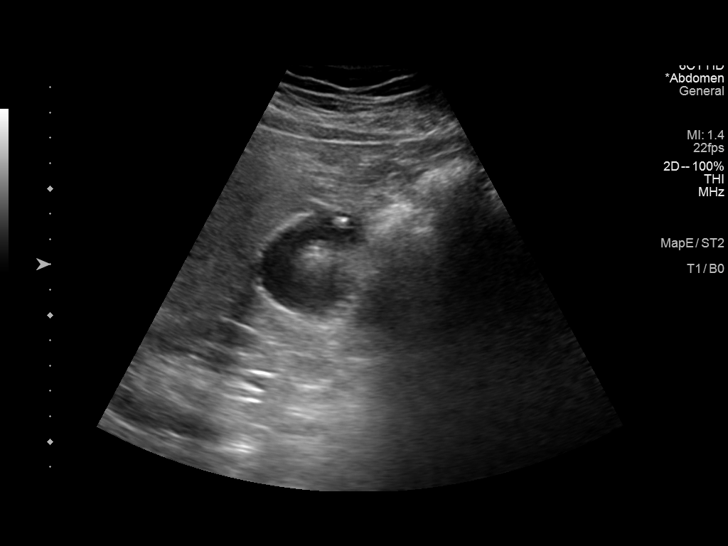
[im 11/41]
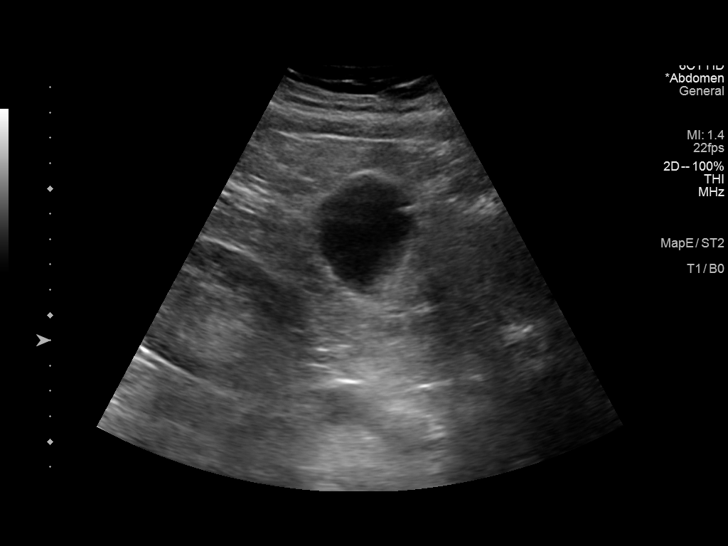
[im 14/41]
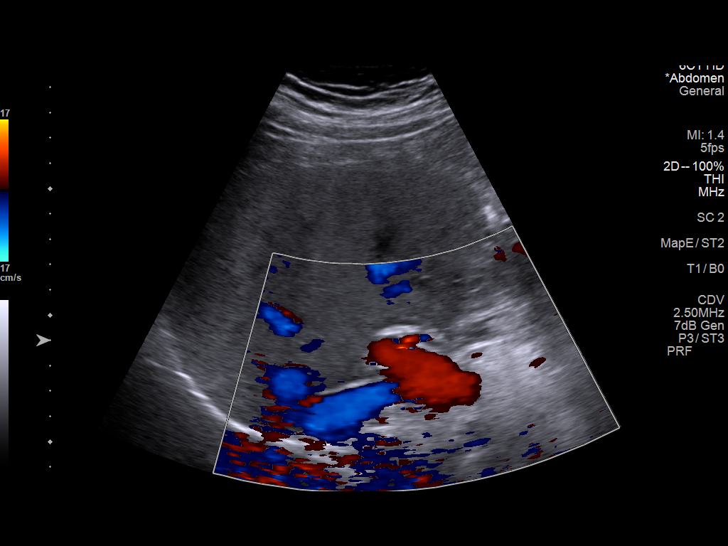
[im 16/41]
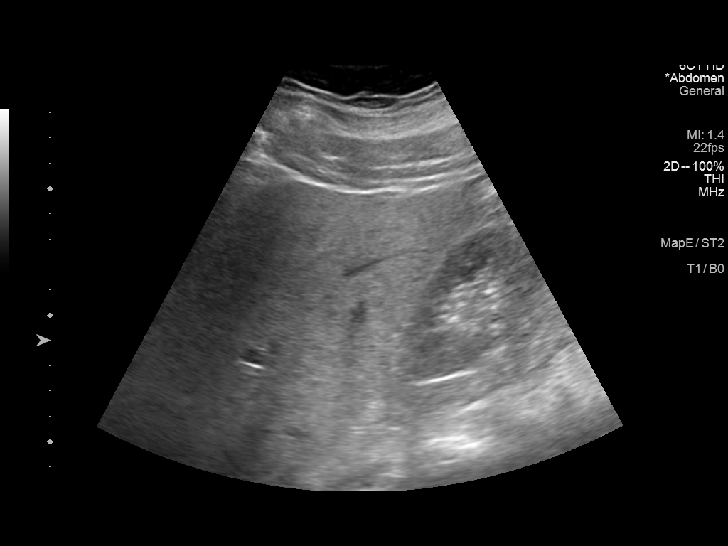
[im 19/41]
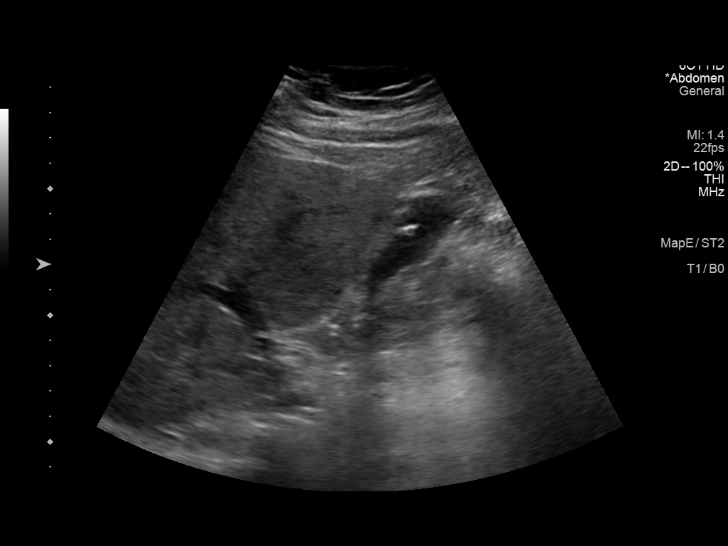
[im 22/41]
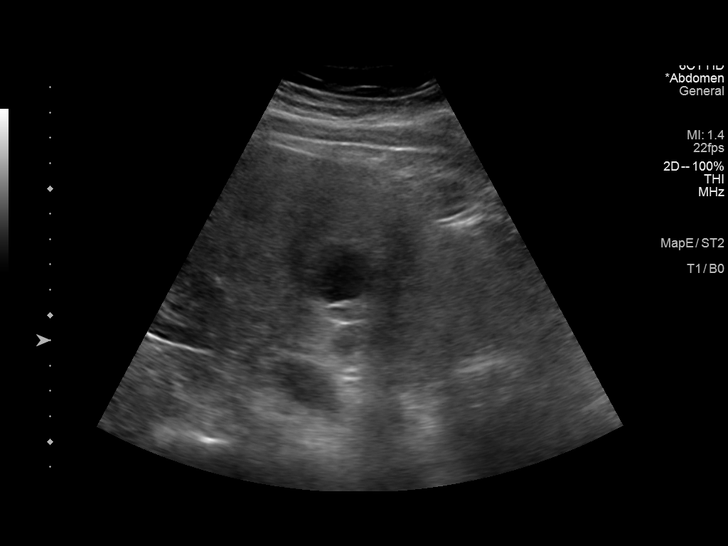
[im 26/41]
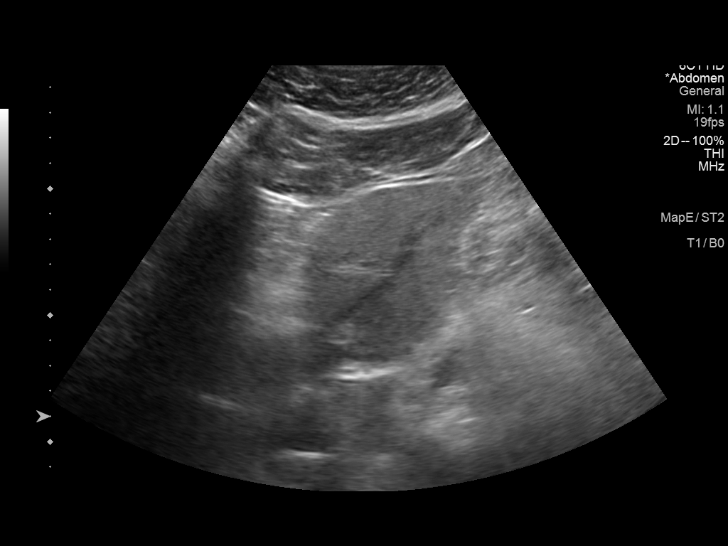
[im 27/41]
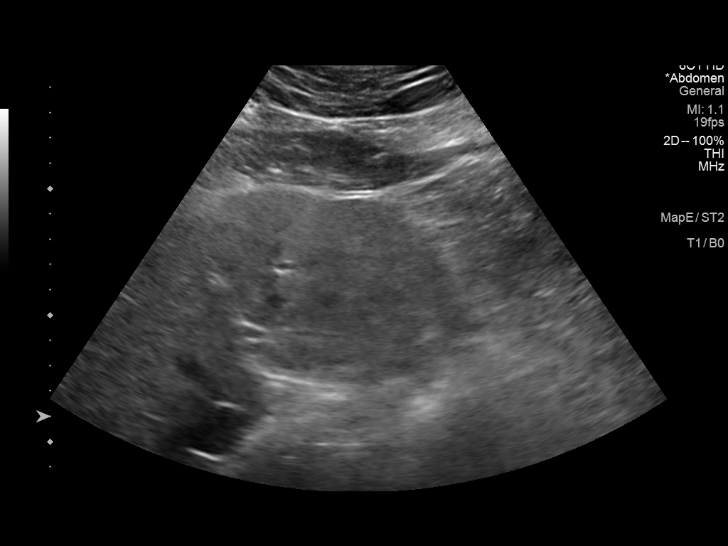
[im 31/41]
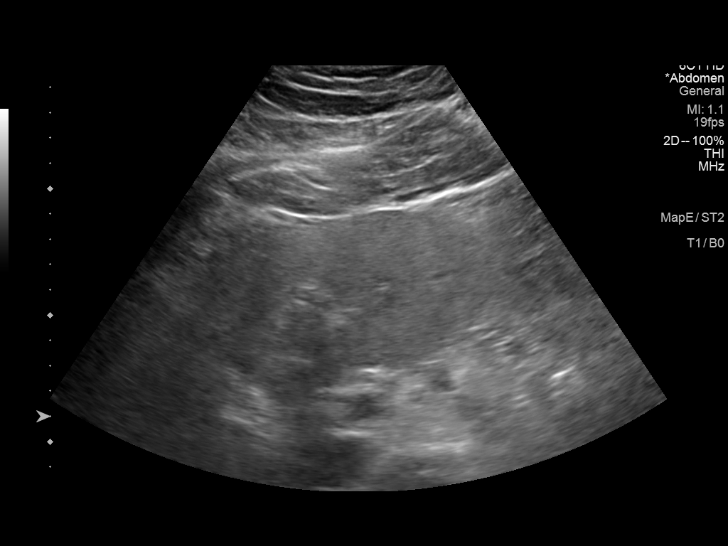
[im 34/41]
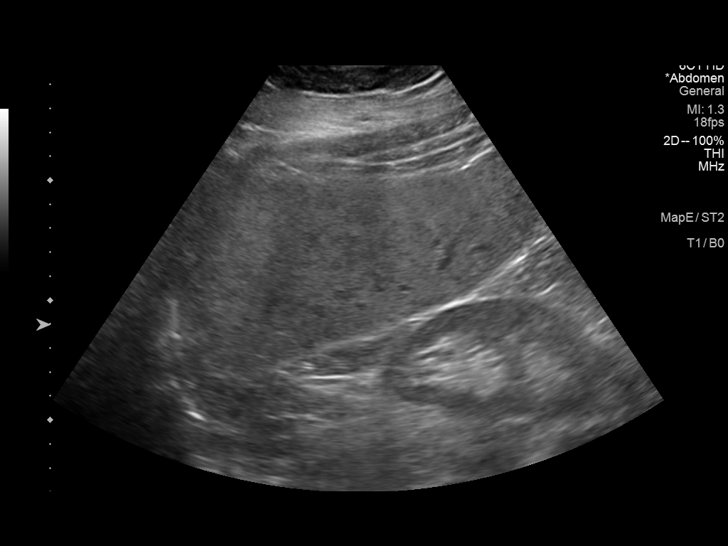
[im 37/41]
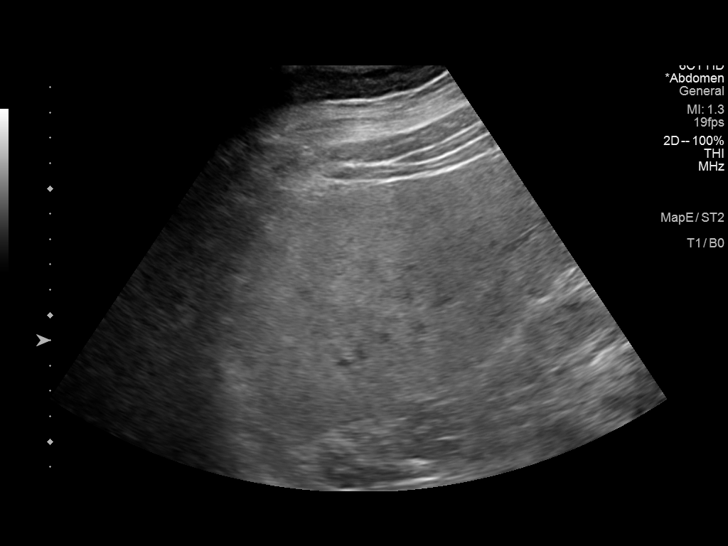
[im 41/41]
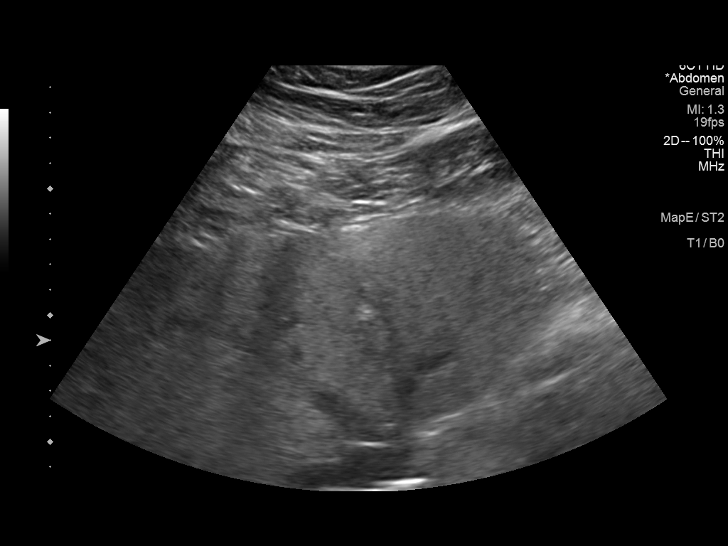

[14 of 25 positions shown; findings below may reference images not displayed]

FINDINGS: Gallbladder:

A 13 mm mobile gallstone is seen within the gallbladder lumen, new
since prior examination. The gallbladder, however, is not distended,
there is no gallbladder wall thickening, and no pericholecystic
fluid is identified. The sonographic Murphy sign is reportedly
negative.

Common bile duct:

Diameter: 2-3 mm in proximal diameter

Liver:

The hepatic parenchymal echotexture is mildly coarsened and the
echogenicity is diffusely increased in keeping with changes of mild
hepatic steatosis. No focal intrahepatic masses are seen. There is
no intrahepatic biliary ductal dilation. Portal vein is patent on
color Doppler imaging with normal direction of blood flow towards
the liver.

Other: None.
IMPRESSION: Cholelithiasis without sonographic evidence of acute cholecystitis.

Mild hepatic steatosis.

## 2022-08-24 ENCOUNTER — Ambulatory Visit: Payer: BC Managed Care – PPO | Admitting: Cardiology

## 2022-08-24 ENCOUNTER — Encounter: Payer: Self-pay | Admitting: Cardiology

## 2022-08-24 VITALS — BP 113/82 | HR 68 | Temp 97.8°F | Resp 16 | Ht 68.0 in | Wt 272.0 lb

## 2022-08-24 DIAGNOSIS — I2510 Atherosclerotic heart disease of native coronary artery without angina pectoris: Secondary | ICD-10-CM

## 2022-08-24 DIAGNOSIS — I5020 Unspecified systolic (congestive) heart failure: Secondary | ICD-10-CM

## 2022-08-24 MED ORDER — EMPAGLIFLOZIN 10 MG PO TABS: 10.0000 mg | ORAL_TABLET | Freq: Every day | ORAL | 3 refills | Status: DC

## 2022-08-24 NOTE — Progress Notes (Signed)
Patient referred by [name], MD for CAD  Subjective:   Dustin Cook, male    DOB: [birth year], 51 y.o.   MRN: [id number]   Chief Complaint  Patient presents with   HFrEF     HPI  51 y.o. Caucasian male with  hypertension, hyperlipidemia, CAD s/p MI in 2017, mild aortic root dilatation   Patient was hospitalized in 07/2022 at [facility] in [facility]. Leading up to then, he had had exertional dyspnea symptoms that led to ER visit and hospitalization. He was found to have EF of 20%, nonobstructive CAD> He was started on Entresto, metoprolol. Follow up labs reviewed, discussed below. His exertional dyspnea, orthopnea, leg edema have nearly resolved. He still feels tired. He has quit smoking, does not drink alcohol regularly.    Current Outpatient Medications:    amLODipine (NORVASC) 10 MG tablet, Take 10 mg by mouth daily., Disp: , Rfl:    aspirin EC 81 MG tablet, Take 81 mg by mouth daily., Disp: , Rfl:    cloNIDine (CATAPRES) 0.2 MG tablet, Take 0.2 mg by mouth at bedtime., Disp: , Rfl:    isosorbide mononitrate (IMDUR) 30 MG 24 hr tablet, Take 30 mg by mouth daily., Disp: , Rfl:    losartan (COZAAR) 100 MG tablet, Take 100 mg by mouth daily., Disp: , Rfl:    metoprolol succinate (TOPROL-XL) 100 MG 24 hr tablet, , Disp: , Rfl:    nitroGLYCERIN (NITROSTAT) 0.4 MG SL tablet, Place under the tongue., Disp: , Rfl:    Potassium 99 MG TABS, Take 99 mg by mouth daily as needed (cramps)., Disp: , Rfl:    rosuvastatin (CRESTOR) 20 MG tablet, Take 20 mg by mouth at bedtime., Disp: , Rfl:    sacubitril-valsartan (ENTRESTO) 24-26 MG, Take 1 tablet twice a day by oral route., Disp: , Rfl:    Calcium-Magnesium (CAL-MAG PO), Take 1 tablet by mouth daily as needed (cramps). (Patient not taking: Reported on 08/24/2022), Disp: , Rfl:    rosuvastatin (CRESTOR) 40 MG tablet, Take 40 mg by mouth daily. (Patient not taking: Reported on 08/24/2022), Disp: , Rfl:   Cardiovascular  studies:  EKG 08/24/2022: Sinus rhythm 62 bpm Leftward axis Occasional ectopic ventricular beat   Nonspecific T-abnormality  08/08/2022: Findings/PostOp Diagnosis: 1. Mild/moderate CAD with 50% mid LAD stenosis, 30-40% mid RCA  stenosis 2. Biventricular volume overload with reduced cardiac index,  secondary pulmonary venous hypertension  Plan: 1. Start IV milrinone due to reduced cardiac index, increased  filling pressures and rise in creatinine 2. Continued IV lasix. 3. Hold Entresto/Farxiga initiation until f/u BMP 4. Suspect 48-72 hours more of diuresis 5. Will need AHF f/u in [place], [place] (where he lives) due to  low EF 6. Stop IV heparin, change lopressor to Toprol due to low EF  External echocardiogram 0/24/2023: CONCLUSIONS    * Ultrasound enhancing agent is used.    * Left ventricular systolic function is severely reduced with an ejection  fraction of 20 % by visual estimation.    * Global hypokinesis of the left ventricle.    * Left ventricular chamber size is severely enlarged.    * Moderate concentric left ventricular hypertrophy.    * Left ventricular diastolic function: Grade I diastolic dysfunction.    * Left atrial chamber is normal with a left atrial volume index biplane  method of disk (BP MOD) of 30 ml/m^2.    * Right ventricular systolic function is normal.    *  Tricuspid annular plane systolic excursion (TAPSE) is 2.4 cm.  TAPSV  measures 11.3 cm/s.    * Right ventricular chamber dimension is normal.    * Right atrial chamber size is normal.    * The aortic root at the sinus of Valsalva is dilated measuring 4.20 cm with  an index of 1.7.    * There is mild diffuse thickening (sclerosis) of the aortic valve cusps.    * There is mild aortic valve stenosis with a peak velocity of 2.1 m/s, mean  gradient of 11 mmHg, and aortic valve area of 2.71 cm2.    * There is moderate aortic valve regurgitation.    * No pulmonary hypertension, estimated pulmonary  arterial systolic pressure  is 10 mmHg.    * There is no pericardial effusion.    * For the indication of myocardial infarction, findings are Reduced EF 20%  with global HK, aortic root at 4.2 cm with moderaet AI and mild AS.   Recent labs: 08/19/2022: Glucose 83, BUN/Cr 22/1.5. EGFR 57. Na/K 139/4.7. H/H 14/48. MCV 87. Platelets 295 Chol 182, TG 245, HDL 47, LDL 98  83, 22/1.5. 57. 139/4.7.  14/43. 87. 295 Chol 182, TG 245, HDL 47, LDL 98   08/07/2022: Glucose 99, BUN/Cr 21/1.4. EGFR 59. Na/K 139/3.7. Rest of the CMP normal H/H 13/40. MCV 86. Platelets 266 Chol 207, TG 134, HDL 45, LDL 135 Trop 130 (0-19) NT proBNP 2116 (<125)   Review of Systems  Constitutional: Positive for malaise/fatigue.  Cardiovascular:  Negative for chest pain, dyspnea on exertion, leg swelling, palpitations and syncope.         Vitals:   08/24/22 0959  BP: 113/82  Pulse: 68  Resp: 16  Temp: 97.8 F (36.6 C)  SpO2: 96%     Body mass index is 41.36 kg/m. Filed Weights   08/24/22 0959  Weight: 272 lb (123.4 kg)     Objective:   Physical Exam Vitals and nursing note reviewed.  Constitutional:      General: He is not in acute distress. Neck:     Vascular: No JVD.  Cardiovascular:     Rate and Rhythm: Normal rate and regular rhythm.     Heart sounds: Normal heart sounds. No murmur heard. Pulmonary:     Effort: Pulmonary effort is normal.     Breath sounds: Normal breath sounds. No wheezing or rales.  Musculoskeletal:     Right lower leg: No edema.     Left lower leg: No edema.           Assessment & Recommendations:   51 y.o. Caucasian male with  hypertension, hyperlipidemia, CAD s/p MI in 2017, mild aortic root dilatation, nonischemic cardiomyopathy, HFrEF  HFrEF: Chronic systolic heart failure, new diagnosis. Nonischemic cardiomyopathy.Etiology unclear. If EF does not improve, will consider cardiac MRI. NYHA class II. Continue Entresto 24-26 mg bid, metoprolol  succinate 100 mg daily.  Added Jardiance 10 mg daily. Check BMP, proBNP in two weeks. In future, could change amlodipine in favor of spironolactone. Recommend low salt <2g diet, daily weight checks  CAD:  Nonobstructive. Continue Aspirin, statin, Imdur  Aortic root dilatation: 4.2 cm on echocardiogram in 07/2022 with mod AI, mild AS. Repeat echocardiogram in 10/2022 (Will order at next visit).  Hypertension: Controlled  Reviewed and independently interpreted external records Time spent: 40 min   [name], MD Pager: [phone] Office: [phone]

## 2022-09-24 LAB — BASIC METABOLIC PANEL
BUN/Creatinine Ratio: 13 (ref 9–20)
BUN: 17 mg/dL (ref 6–24)
CO2: 21 mmol/L (ref 20–29)
Calcium: 9.4 mg/dL (ref 8.7–10.2)
Chloride: 104 mmol/L (ref 96–106)
Creatinine, Ser: 1.32 mg/dL — ABNORMAL HIGH (ref 0.76–1.27)
Glucose: 116 mg/dL — ABNORMAL HIGH (ref 70–99)
Potassium: 4.4 mmol/L (ref 3.5–5.2)
Sodium: 142 mmol/L (ref 134–144)
eGFR: 66 mL/min/{1.73_m2} (ref >59)

## 2022-09-24 LAB — PRO B NATRIURETIC PEPTIDE: NT-Pro BNP: 245 pg/mL — ABNORMAL HIGH (ref 0–121)

## 2022-09-26 ENCOUNTER — Emergency Department (HOSPITAL_COMMUNITY): Payer: BC Managed Care – PPO

## 2022-09-26 ENCOUNTER — Encounter (HOSPITAL_COMMUNITY): Payer: Self-pay

## 2022-09-26 ENCOUNTER — Ambulatory Visit (HOSPITAL_COMMUNITY)
Admission: EM | Admit: 2022-09-26 | Discharge: 2022-09-26 | Disposition: A | Discharge status: Home / Self Care | Payer: BC Managed Care – PPO | Attending: Family Medicine | Admitting: Family Medicine

## 2022-09-26 ENCOUNTER — Encounter (HOSPITAL_COMMUNITY): Payer: Self-pay | Admitting: Emergency Medicine

## 2022-09-26 ENCOUNTER — Ambulatory Visit: Payer: BC Managed Care – PPO | Admitting: Cardiology

## 2022-09-26 ENCOUNTER — Emergency Department (HOSPITAL_COMMUNITY)
Admission: EM | Admit: 2022-09-26 | Discharge: 2022-09-27 | Disposition: A | Discharge status: Home / Self Care | Payer: BC Managed Care – PPO | Attending: Emergency Medicine | Admitting: Emergency Medicine

## 2022-09-26 ENCOUNTER — Encounter: Payer: Self-pay | Admitting: Cardiology

## 2022-09-26 ENCOUNTER — Other Ambulatory Visit: Payer: Self-pay

## 2022-09-26 VITALS — BP 149/98 | HR 76 | Resp 15 | Ht 68.0 in | Wt 273.0 lb

## 2022-09-26 DIAGNOSIS — Z7982 Long term (current) use of aspirin: Secondary | ICD-10-CM | POA: Diagnosis not present

## 2022-09-26 DIAGNOSIS — I110 Hypertensive heart disease with heart failure: Secondary | ICD-10-CM | POA: Diagnosis not present

## 2022-09-26 DIAGNOSIS — R1031 Right lower quadrant pain: Secondary | ICD-10-CM | POA: Insufficient documentation

## 2022-09-26 DIAGNOSIS — I2510 Atherosclerotic heart disease of native coronary artery without angina pectoris: Secondary | ICD-10-CM | POA: Insufficient documentation

## 2022-09-26 DIAGNOSIS — Z79899 Other long term (current) drug therapy: Secondary | ICD-10-CM | POA: Diagnosis not present

## 2022-09-26 DIAGNOSIS — I5020 Unspecified systolic (congestive) heart failure: Secondary | ICD-10-CM

## 2022-09-26 DIAGNOSIS — I77810 Thoracic aortic ectasia: Secondary | ICD-10-CM

## 2022-09-26 DIAGNOSIS — I10 Essential (primary) hypertension: Secondary | ICD-10-CM

## 2022-09-26 DIAGNOSIS — I509 Heart failure, unspecified: Secondary | ICD-10-CM | POA: Diagnosis not present

## 2022-09-26 LAB — COMPREHENSIVE METABOLIC PANEL
ALT: 27 U/L (ref 0–44)
AST: 21 U/L (ref 15–41)
Albumin: 4.4 g/dL (ref 3.5–5.0)
Alkaline Phosphatase: 77 U/L (ref 38–126)
Anion gap: 10 (ref 5–15)
BUN: 19 mg/dL (ref 6–20)
CO2: 21 mmol/L — ABNORMAL LOW (ref 22–32)
Calcium: 9.6 mg/dL (ref 8.9–10.3)
Chloride: 108 mmol/L (ref 98–111)
Creatinine, Ser: 1.35 mg/dL — ABNORMAL HIGH (ref 0.61–1.24)
GFR, Estimated: >60 mL/min (ref >60)
Glucose, Bld: 92 mg/dL (ref 70–99)
Potassium: 4.2 mmol/L (ref 3.5–5.1)
Sodium: 139 mmol/L (ref 135–145)
Total Bilirubin: 0.3 mg/dL (ref 0.3–1.2)
Total Protein: 7.8 g/dL (ref 6.5–8.1)

## 2022-09-26 LAB — URINALYSIS, ROUTINE W REFLEX MICROSCOPIC
Bacteria, UA: NONE SEEN
Bilirubin Urine: NEGATIVE
Glucose, UA: >=500 mg/dL — AB
Hgb urine dipstick: NEGATIVE
Ketones, ur: NEGATIVE mg/dL
Leukocytes,Ua: NEGATIVE
Nitrite: NEGATIVE
Protein, ur: 30 mg/dL — AB
Specific Gravity, Urine: 1.023 (ref 1.005–1.030)
pH: 5 (ref 5.0–8.0)

## 2022-09-26 LAB — CBC WITH DIFFERENTIAL/PLATELET
Abs Immature Granulocytes: 0.11 10*3/uL — ABNORMAL HIGH (ref 0.00–0.07)
Basophils Absolute: 0.1 10*3/uL (ref 0.0–0.1)
Basophils Relative: 1 %
Eosinophils Absolute: 0.3 10*3/uL (ref 0.0–0.5)
Eosinophils Relative: 3 %
HCT: 45.7 % (ref 39.0–52.0)
Hemoglobin: 15.5 g/dL (ref 13.0–17.0)
Immature Granulocytes: 1 %
Lymphocytes Relative: 30 %
Lymphs Abs: 3 10*3/uL (ref 0.7–4.0)
MCH: 29.3 pg (ref 26.0–34.0)
MCHC: 33.9 g/dL (ref 30.0–36.0)
MCV: 86.4 fL (ref 80.0–100.0)
Monocytes Absolute: 0.8 10*3/uL (ref 0.1–1.0)
Monocytes Relative: 8 %
Neutro Abs: 5.7 10*3/uL (ref 1.7–7.7)
Neutrophils Relative %: 57 %
Platelets: 248 10*3/uL (ref 150–400)
RBC: 5.29 MIL/uL (ref 4.22–5.81)
RDW: 13.2 % (ref 11.5–15.5)
WBC: 10 10*3/uL (ref 4.0–10.5)
nRBC: 0 % (ref 0.0–0.2)

## 2022-09-26 LAB — POCT URINALYSIS DIPSTICK, ED / UC
Bilirubin Urine: NEGATIVE
Glucose, UA: >=1000 mg/dL — AB
Hgb urine dipstick: NEGATIVE
Ketones, ur: NEGATIVE mg/dL
Leukocytes,Ua: NEGATIVE
Nitrite: NEGATIVE
Protein, ur: 30 mg/dL — AB
Specific Gravity, Urine: 1.02 (ref 1.005–1.030)
Urobilinogen, UA: 0.2 mg/dL (ref 0.0–1.0)
pH: 5.5 (ref 5.0–8.0)

## 2022-09-26 MED ORDER — METOPROLOL SUCCINATE ER 100 MG PO TB24: 100.0000 mg | ORAL_TABLET | Freq: Every day | ORAL | 3 refills | Status: DC

## 2022-09-26 MED ORDER — IOHEXOL 350 MG/ML SOLN
100.0000 mL | Freq: Once | INTRAVENOUS | Status: AC | PRN
  Administered 2022-09-26: 100 mL via INTRAVENOUS

## 2022-09-26 MED ORDER — ENTRESTO 24-26 MG PO TABS: 1.0000 | ORAL_TABLET | Freq: Two times a day (BID) | ORAL | 3 refills | Status: DC

## 2022-09-26 MED ORDER — EMPAGLIFLOZIN 10 MG PO TABS: 10.0000 mg | ORAL_TABLET | Freq: Every day | ORAL | 3 refills | Status: DC

## 2022-09-26 NOTE — ED Provider Notes (Signed)
Dustin Cook    CSN: [id number] Arrival date & time: 09/26/22  1519      History   Chief Complaint Chief Complaint  Patient presents with   Back Pain    HPI Dustin Cook is a 51 y.o. male.    Back Pain  Here for right lower quadrant pain.  About a week ago he was having some right low back pain.  That has since stopped that for about 3 days he has had sharp right lower quadrant pain.  It is coming and going.  No nausea or vomiting or fever.  No dysuria no hematuria.  Last bowel movement was this morning and normal.  When he hit bumps in the car on the way here it did hurt in his right lower quadrant more. He states that on previous studies there have been incidental findings of a kidney stone.  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   Tobacco abuse 04/26/2019   Mixed hyperlipidemia 04/26/2019   Coronary artery disease involving native coronary artery of native heart without angina pectoris 01/30/2019   Essential hypertension 12/25/2017    Past Surgical History:  Procedure Laterality Date   ANGIOPLASTY     CARDIAC CATHETERIZATION     COLONOSCOPY WITH PROPOFOL N/A 03/12/2021   Procedure: COLONOSCOPY WITH PROPOFOL;  Surgeon: Carol Ada, MD;  Location: [facility];  Service: Endoscopy;  Laterality: N/A;   CORONARY ANGIOPLASTY     POLYPECTOMY  03/12/2021   Procedure: POLYPECTOMY;  Surgeon: Carol Ada, MD;  Location: [facility];   Service: Endoscopy;;       Home Medications    Prior to Admission medications   Medication Sig Start Date End Date Taking? Authorizing Provider  amLODipine (NORVASC) 10 MG tablet Take 10 mg by mouth daily. 01/31/19   [provider]  aspirin EC 81 MG tablet Take 81 mg by mouth daily.    [provider]  cloNIDine (CATAPRES) 0.2 MG tablet Take 0.2 mg by mouth at bedtime. 01/06/19   [provider]  empagliflozin (JARDIANCE) 10 MG TABS tablet Take 1 tablet (10 mg total) by mouth daily before breakfast. 09/26/22   Patwardhan, Manish J, MD  hydrALAZINE (APRESOLINE) 25 MG tablet Take 25 mg by mouth 3 (three) times daily.    [provider]  isosorbide mononitrate (IMDUR) 30 MG 24 hr tablet Take 30 mg by mouth daily. Patient not taking: Reported on 09/26/2022 08/10/22   [provider]  metoprolol succinate (TOPROL-XL) 100 MG 24 hr tablet Take 1 tablet (100 mg total) by mouth daily. 09/26/22   Patwardhan, Reynold Bowen, MD  nitroGLYCERIN (NITROSTAT) 0.4 MG SL tablet Place under the tongue. 08/10/22   [provider]  rosuvastatin (CRESTOR) 20 MG tablet Take 20 mg by mouth at bedtime. 08/10/22   [provider]  rosuvastatin (CRESTOR) 40 MG tablet Take 40 mg by mouth daily.    [provider]  sacubitril-valsartan (ENTRESTO) 24-26 MG Take 1 tablet by mouth 2 (two) times daily. 09/26/22   Patwardhan, Reynold Bowen, MD    Family History Family History  Problem Relation Age of Onset   Renal Disease Mother    Kidney disease Mother    Heart disease Mother    Heart attack Father    Heart disease Brother     Social History Social History   Tobacco Use   Smoking status: Former    Packs/day: 1.00    Years: 18.00    Total pack years: 18.00    Types: Cigarettes    Quit date: 11/2020    Years since quitting: 1.8   Smokeless tobacco: Never   Tobacco comments:    since first of the year  Vaping Use   Vaping Use: Never used   Substance Use Topics   Alcohol use: Yes    Comment: SOCIAL   Drug use: Not Currently     Allergies   Patient has no known allergies.   Review of Systems Review of Systems  Musculoskeletal:  Positive for back pain.     Physical Exam Triage Vital Signs ED Triage Vitals  Enc Vitals Group     BP 09/26/22 1702 (!) 153/90     Pulse Rate 09/26/22 1702 81     Resp 09/26/22 1702 20     Temp 09/26/22 1702 (!) 97.5 F (36.4 C)     Temp Source 09/26/22 1702 Oral     SpO2 09/26/22 1702 95 %     Weight --      Height --      Head Circumference --      Peak Flow --      Pain Score 09/26/22 1700 0     Pain Loc --      Pain Edu? --      Excl. in [place]? --    No data found.  Updated Vital Signs BP (!) 153/90 (BP Location: Left Arm)   Pulse 81   Temp (!) 97.5 F (36.4 C) (Oral)   Resp 20   SpO2 95%   Visual Acuity Right Eye Distance:   Left Eye Distance:   Bilateral Distance:    Right Eye Near:   Left Eye Near:    Bilateral Near:     Physical Exam Vitals reviewed.  Constitutional:      General: He is not in acute distress.    Appearance: He is not ill-appearing, toxic-appearing or diaphoretic.  HENT:     Mouth/Throat:     Mouth: Mucous membranes are moist.     Pharynx: No oropharyngeal exudate or posterior oropharyngeal erythema.  Eyes:     Extraocular Movements: Extraocular movements intact.     Conjunctiva/sclera: Conjunctivae normal.     Pupils: Pupils are equal, round, and reactive to light.  Cardiovascular:     Rate and Rhythm: Normal rate and regular rhythm.     Heart sounds: No murmur heard. Pulmonary:     Effort: Pulmonary effort is normal.     Breath sounds: Normal breath sounds. No stridor. No wheezing, rhonchi or rales.  Abdominal:     General: There is no distension.     Palpations: There is no mass.     Tenderness: There is abdominal tenderness (RLQ, very tender).     Comments: BS present  Musculoskeletal:     Cervical back: Neck supple.   Lymphadenopathy:     Cervical: No cervical adenopathy.  Skin:  Capillary Refill: Capillary refill takes less than 2 seconds.     Coloration: Skin is not jaundiced or pale.  Neurological:     General: No focal deficit present.     Mental Status: He is alert and oriented to person, place, and time.  Psychiatric:        Behavior: Behavior normal.      UC Treatments / Results  Labs (all labs ordered are listed, but only abnormal results are displayed) Labs Reviewed  POCT URINALYSIS DIPSTICK, ED / UC - Abnormal; Notable for the following components:      Result Value   Glucose, UA >=1000 (*)    Protein, ur 30 (*)    All other components within normal limits    EKG   Radiology No results found.  Procedures Procedures (including critical care time)  Medications Ordered in UC Medications - No data to display  Initial Impression / Assessment and Plan / UC Course  I have reviewed the triage vital signs and the nursing notes.  Pertinent labs & imaging results that were available during my care of the patient were reviewed by me and considered in my medical decision making (see chart for details).        The urinalysis shows absolutely no blood.  He is very tender in his right lower quadrant and I think it best that he evaluated in the emergency room on an urgent basis. Final Clinical Impressions(s) / UC Diagnoses   Final diagnoses:  Right lower quadrant abdominal pain     Discharge Instructions      Please proceed to the ER     ED Prescriptions   None    I have reviewed the PDMP during this encounter.   Barrett Henle, MD 09/26/22 1730

## 2022-09-26 NOTE — ED Triage Notes (Signed)
Pt to ED from UC. Pt c/o RLQ pain x 3 days, sent from [facility] for CT scan.  Pt denies fever, nausea, vomiting, and/or urinary symptoms.  Last BM was 10am.

## 2022-09-26 NOTE — Progress Notes (Signed)
Patient referred by [name], MD for CAD  Subjective:   Dustin Cook, male    DOB: 02-14-1971, 51 y.o.   MRN: [id number]   Chief Complaint  Patient presents with   HFrEF   Follow-up     HPI  51 y.o. Caucasian male with  hypertension, hyperlipidemia, CAD s/p MI in 2017, mild aortic root dilatation, HFrEF  Patient has had resolution of dyspnea and leg swelling symptoms. Blood pressure elevated today, but he has had right flank and lower abdominal pain. He has known to have kidney stones in the past. He is going to urgent care for the same.    Current Outpatient Medications:    amLODipine (NORVASC) 10 MG tablet, Take 10 mg by mouth daily., Disp: , Rfl:    aspirin EC 81 MG tablet, Take 81 mg by mouth daily., Disp: , Rfl:    cloNIDine (CATAPRES) 0.2 MG tablet, Take 0.2 mg by mouth at bedtime., Disp: , Rfl:    empagliflozin (JARDIANCE) 10 MG TABS tablet, Take 1 tablet (10 mg total) by mouth daily before breakfast., Disp: 30 tablet, Rfl: 3   hydrALAZINE (APRESOLINE) 25 MG tablet, Take 25 mg by mouth 3 (three) times daily., Disp: , Rfl:    labetalol (NORMODYNE) 200 MG tablet, Take 200 mg by mouth 2 (two) times daily., Disp: , Rfl:    metoprolol succinate (TOPROL-XL) 100 MG 24 hr tablet, Take 100 mg by mouth daily., Disp: , Rfl:    nitroGLYCERIN (NITROSTAT) 0.4 MG SL tablet, Place under the tongue., Disp: , Rfl:    rosuvastatin (CRESTOR) 20 MG tablet, Take 20 mg by mouth at bedtime., Disp: , Rfl:    rosuvastatin (CRESTOR) 40 MG tablet, Take 40 mg by mouth daily., Disp: , Rfl:    sacubitril-valsartan (ENTRESTO) 24-26 MG, Take 1 tablet twice a day by oral route., Disp: , Rfl:    isosorbide mononitrate (IMDUR) 30 MG 24 hr tablet, Take 30 mg by mouth daily. (Patient not taking: Reported on 09/26/2022), Disp: , Rfl:   Cardiovascular studies:  EKG 08/24/2022: Sinus rhythm 62 bpm Leftward axis Occasional ectopic ventricular beat   Nonspecific  T-abnormality  08/08/2022: Findings/PostOp Diagnosis: 1. Mild/moderate CAD with 50% mid LAD stenosis, 30-40% mid RCA  stenosis 2. Biventricular volume overload with reduced cardiac index,  secondary pulmonary venous hypertension  Plan: 1. Start IV milrinone due to reduced cardiac index, increased  filling pressures and rise in creatinine 2. Continued IV lasix. 3. Hold Entresto/Farxiga initiation until f/u BMP 4. Suspect 48-72 hours more of diuresis 5. Will need AHF f/u in [place], [place] (where he lives) due to  low EF 6. Stop IV heparin, change lopressor to Toprol due to low EF  External echocardiogram 0/24/2023: CONCLUSIONS    * Ultrasound enhancing agent is used.    * Left ventricular systolic function is severely reduced with an ejection  fraction of 20 % by visual estimation.    * Global hypokinesis of the left ventricle.    * Left ventricular chamber size is severely enlarged.    * Moderate concentric left ventricular hypertrophy.    * Left ventricular diastolic function: Grade I diastolic dysfunction.    * Left atrial chamber is normal with a left atrial volume index biplane  method of disk (BP MOD) of 30 ml/m^2.    * Right ventricular systolic function is normal.    * Tricuspid annular plane systolic excursion (TAPSE) is 2.4 cm.  TAPSV  measures 11.3 cm/s.    *  Right ventricular chamber dimension is normal.    * Right atrial chamber size is normal.    * The aortic root at the sinus of Valsalva is dilated measuring 4.20 cm with  an index of 1.7.    * There is mild diffuse thickening (sclerosis) of the aortic valve cusps.    * There is mild aortic valve stenosis with a peak velocity of 2.1 m/s, mean  gradient of 11 mmHg, and aortic valve area of 2.71 cm2.    * There is moderate aortic valve regurgitation.    * No pulmonary hypertension, estimated pulmonary arterial systolic pressure  is 10 mmHg.    * There is no pericardial effusion.    * For the indication of  myocardial infarction, findings are Reduced EF 20%  with global HK, aortic root at 4.2 cm with moderaet AI and mild AS.   Recent labs: 09/23/2022: Glucose 116, BUN/Cr 17/1.32. EGFR 66. Na/K 142/4.4.  NT proBNP 245  08/19/2022: Glucose 83, BUN/Cr 22/1.5. EGFR 57. Na/K 139/4.7. H/H 14/48. MCV 87. Platelets 295 Chol 182, TG 245, HDL 47, LDL 98  83, 22/1.5. 57. 139/4.7.  14/43. 87. 295 Chol 182, TG 245, HDL 47, LDL 98   08/07/2022: Glucose 99, BUN/Cr 21/1.4. EGFR 59. Na/K 139/3.7. Rest of the CMP normal H/H 13/40. MCV 86. Platelets 266 Chol 207, TG 134, HDL 45, LDL 135 Trop 130 (0-19) NT proBNP 2116 (<125)   Review of Systems  Constitutional: Negative for malaise/fatigue.  Cardiovascular:  Negative for chest pain, dyspnea on exertion, leg swelling, palpitations and syncope.  Gastrointestinal:  Positive for abdominal pain.         Vitals:   09/26/22 1440  BP: (!) 149/98  Pulse: 76  Resp: 15  SpO2: 96%     Body mass index is 41.51 kg/m. Filed Weights   09/26/22 1440  Weight: 273 lb (123.8 kg)     Objective:   Physical Exam Vitals and nursing note reviewed.  Constitutional:      General: He is not in acute distress. Neck:     Vascular: No JVD.  Cardiovascular:     Rate and Rhythm: Normal rate and regular rhythm.     Heart sounds: Normal heart sounds. No murmur heard. Pulmonary:     Effort: Pulmonary effort is normal.     Breath sounds: Normal breath sounds. No wheezing or rales.  Abdominal:     Tenderness: There is abdominal tenderness (RLQ).  Musculoskeletal:     Right lower leg: No edema.     Left lower leg: No edema.           Assessment & Recommendations:   51 y.o. Caucasian male with  hypertension, hyperlipidemia, CAD s/p MI in 2017, mild aortic root dilatation, nonischemic cardiomyopathy, HFrEF  HFrEF: Chronic systolic heart failure, now compensated. Nonischemic cardiomyopathy.Etiology unclear. If EF does not improve, will consider  cardiac MRI. NYHA class II. Continue Entresto 24-26 mg bid, metoprolol succinate 100 mg daily, Jardiance 10 mg daily. NT proBNP reduced. Blood pressure elevated today, could be due to pain. In future, could change amlodipine in favor of spironolactone. Recommend low salt <2g diet, daily weight checks  CAD:  Nonobstructive. Continue Aspirin, statin, Imdur  Aortic root dilatation: 4.2 cm on echocardiogram in 07/2022 with mod AI, mild AS. Repeat echocardiogram in 10/2022.  Hypertension: Controlled  F/u in 2 months    [name], MD Pager: [phone] Office: [phone]

## 2022-09-26 NOTE — Discharge Instructions (Signed)
Please proceed to the ER 

## 2022-09-26 NOTE — ED Triage Notes (Signed)
Started the week with lower back pain, the last 3 days has been severe pain.  Patient states he has been urinating with out difficulty.    Patient has been taking ibuprofen yesterday.    Patient does not have a urologist.  Patient reports he was told he had kidney stones on a prior study.

## 2022-09-26 NOTE — ED Provider Triage Note (Signed)
Emergency Medicine Provider Triage Evaluation Note  Dustin Cook , a 51 y.o. male  was evaluated in triage.  Pt complains of right lower abdominal pain for 3 days   Review of Systems  Positive: Flank pain  Negative: fever  Physical Exam  BP (!) 163/109 (BP Location: Right Arm)   Pulse 76   Temp 98.4 F (36.9 C)   Resp 18   Ht '5\' 8"'$  (1.727 m)   Wt 122.9 kg   SpO2 94%   BMI 41.21 kg/m  Gen:   Awake, no distress   Resp:  Normal effort  MSK:   Moves extremities without difficulty  Other:  Abdomen  tender right lower abdomen  Medical Decision Making  Medically screening exam initiated at 7:24 PM.  Appropriate orders placed.  Dustin Cook was informed that the remainder of the evaluation will be completed by another provider, this initial triage assessment does not replace that evaluation, and the importance of remaining in the ED until their evaluation is complete.     Dustin Cook, [place] 09/26/22 1925

## 2022-09-27 NOTE — ED Provider Notes (Signed)
[facility] EMERGENCY DEPARTMENT Provider Note   CSN: [id number] Arrival date & time: 09/26/22  1743     History Past medical history of hypertension, hyperlipidemia, heart failure with reduced ejection fraction, coronary artery disease, sleep apnea Chief Complaint  Patient presents with   Abdominal Pain    Dustin Cook is a 51 y.o. male.  Patient presenting with 3 days of right lower quadrant pain that has been constant, wax and wanes in intensity, and does not move.  He was seen by urgent care yesterday afternoon and referred to the emergency department for evaluation for possible appendicitis.  He denies any fevers, chills, nausea, vomiting, diarrhea, constipation, hematuria, flank pain, dysuria, testicular symptoms.  Abdominal Pain      Home Medications Prior to Admission medications   Medication Sig Start Date End Date Taking? Authorizing Provider  amLODipine (NORVASC) 10 MG tablet Take 10 mg by mouth daily. 01/31/19   [provider]  aspirin EC 81 MG tablet Take 81 mg by mouth daily.    [provider]  cloNIDine (CATAPRES) 0.2 MG tablet Take 0.2 mg by mouth at bedtime. 01/06/19   [provider]  empagliflozin (JARDIANCE) 10 MG TABS tablet Take 1 tablet (10 mg total) by mouth daily before breakfast. 09/26/22   Patwardhan, Manish J, MD  hydrALAZINE (APRESOLINE) 25 MG tablet Take 25 mg by mouth 3 (three) times daily.    [provider]  isosorbide mononitrate (IMDUR) 30 MG 24 hr tablet Take 30 mg by mouth daily. Patient not taking: Reported on 09/26/2022 08/10/22   [provider]  metoprolol succinate (TOPROL-XL) 100 MG 24 hr tablet Take 1 tablet (100 mg total) by mouth daily. 09/26/22   Patwardhan, Reynold Bowen, MD  nitroGLYCERIN (NITROSTAT) 0.4 MG SL tablet Place under the tongue. 08/10/22   [provider]  rosuvastatin (CRESTOR) 20 MG tablet Take 20 mg by mouth at bedtime. 08/10/22   [provider]  rosuvastatin (CRESTOR) 40 MG tablet Take 40 mg by mouth daily.    [provider]  sacubitril-valsartan (ENTRESTO) 24-26 MG Take 1 tablet by mouth 2 (two) times daily. 09/26/22   Patwardhan, Reynold Bowen, MD      Allergies    Patient has no known allergies.    Review of Systems   Review of Systems  Gastrointestinal:  Positive for abdominal pain.  All other systems reviewed and are negative.   Physical Exam Updated Vital Signs BP (!) 146/119 (BP Location: Right Arm)   Pulse 91   Temp 98.2 F (36.8 C) (Oral)   Resp 20   Ht '5\' 8"'$  (1.727 m)   Wt 122.9 kg   SpO2 98%   BMI 41.21 kg/m  Physical Exam Vitals and nursing note reviewed.  Constitutional:      General: He is not in acute distress.    Appearance: Normal appearance. He is well-developed. He is not ill-appearing, toxic-appearing or diaphoretic.  HENT:     Head: Normocephalic and atraumatic.     Nose: No nasal deformity.     Mouth/Throat:     Lips: Pink. No lesions.  Eyes:     General: Gaze aligned appropriately. No scleral icterus.       Right eye: No discharge.        Left eye: No discharge.     Conjunctiva/sclera: Conjunctivae normal.     Right eye: Right conjunctiva is not injected. No exudate or hemorrhage.    Left eye: Left conjunctiva is  not injected. No exudate or hemorrhage. Pulmonary:     Effort: Pulmonary effort is normal. No respiratory distress.  Abdominal:     General: Abdomen is flat. There is no distension.     Palpations: Abdomen is soft. There is no pulsatile mass.     Tenderness: There is abdominal tenderness in the right lower quadrant. There is no right CVA tenderness or left CVA tenderness. Positive signs include McBurney's sign.     Hernia: No hernia is present.     Comments: Moderate RLQ tenderness  Skin:    General: Skin is warm and dry.  Neurological:     Mental Status: He is alert and oriented to person, place, and time.  Psychiatric:        Mood and Affect: Mood  normal.        Speech: Speech normal.        Behavior: Behavior normal. Behavior is cooperative.     ED Results / Procedures / Treatments   Labs (all labs ordered are listed, but only abnormal results are displayed) Labs Reviewed  CBC WITH DIFFERENTIAL/PLATELET - Abnormal; Notable for the following components:      Result Value   Abs Immature Granulocytes 0.11 (*)    All other components within normal limits  COMPREHENSIVE METABOLIC PANEL - Abnormal; Notable for the following components:   CO2 21 (*)    Creatinine, Ser 1.35 (*)    All other components within normal limits  URINALYSIS, ROUTINE W REFLEX MICROSCOPIC - Abnormal; Notable for the following components:   Glucose, UA >=500 (*)    Protein, ur 30 (*)    All other components within normal limits    EKG None  Radiology CT ABDOMEN PELVIS W CONTRAST  Result Date: 09/26/2022 CLINICAL DATA:  Right lower quadrant pain for 3 days EXAM: CT ABDOMEN AND PELVIS WITH CONTRAST TECHNIQUE: Multidetector CT imaging of the abdomen and pelvis was performed using the standard protocol following bolus administration of intravenous contrast. RADIATION DOSE REDUCTION: This exam was performed according to the departmental dose-optimization program which includes automated exposure control, adjustment of the mA and/or kV according to patient size and/or use of iterative reconstruction technique. CONTRAST:  119m OMNIPAQUE IOHEXOL 350 MG/ML SOLN COMPARISON:  None Available. FINDINGS: Lower chest: No acute pleural or parenchymal lung disease. The heart is enlarged without pericardial effusion. Hepatobiliary: Mild diffuse hepatic steatosis. No focal liver abnormality or intrahepatic biliary duct dilation. The gallbladder is unremarkable. Pancreas: Unremarkable. No pancreatic ductal dilatation or surrounding inflammatory changes. Spleen: Normal in size without focal abnormality. Adrenals/Urinary Tract: Adrenal glands are unremarkable. Kidneys are normal,  without renal calculi, focal lesion, or hydronephrosis. Bladder is unremarkable. Stomach/Bowel: There is no bowel obstruction or ileus. There is fat stranding in the right lower quadrant between the cecum and the appendix. There is borderline dilation of the appendix measuring 8 mm in diameter, but no evidence of hyperenhancement or mural thickening. There may be some mild mural thickening of the cecum. I would favor inflammatory/infectious colitis or epiploic appendagitis, with acute appendicitis felt to be less likely. There is diverticulosis of the descending and sigmoid colon, with no evidence of acute diverticulitis in that region. Small hiatal hernia. Vascular/Lymphatic: No significant vascular findings are present. No enlarged abdominal or pelvic lymph nodes. Reproductive: Prostate is unremarkable. Other: No free fluid or free intraperitoneal gas. No abdominal wall hernia. Musculoskeletal: No acute or destructive bony lesions. Prominent L5-S1 spondylosis. Reconstructed images demonstrate no additional findings. IMPRESSION: 1. There are inflammatory changes  within the mesenteric fat interposed between the cecum and appendix in the right lower quadrant as described above. While there is borderline dilation of the appendix, there is no mucosal hyperenhancement or appendiceal wall thickening to suggest acute appendicitis. I would favor focal cecal colitis or epiploic appendagitis as the likely etiology. 2. Mild hepatic steatosis. Electronically Signed   By: Randa Ngo M.D.   On: 09/26/2022 21:14    Procedures Procedures   Medications Ordered in ED Medications  iohexol (OMNIPAQUE) 350 MG/ML injection 100 mL (100 mLs Intravenous Contrast Given 09/26/22 2051)    ED Course/ Medical Decision Making/ A&P Clinical Course as of 09/27/22 1256  Tue Sep 27, 2022  1246 I discussed case with Dr. Bobbye Morton with general surgery. She has reviewed CT scan and agrees with interpretation of most likely this being  epiploic appendagitis versus colitis. Recommends NSAIDS and symptom control at home with return precautions [GL]    Clinical Course User Index [GL] Legacie Dillingham, Adora Fridge, PA-C                           Medical Decision Making   MDM t This is a 51 y.o. male who presents to the ED with three days of RLQ abdominal pain The differential of this patient includes but is not limited to Appendicitis, hernia, Diverticulitis, Testicular Torsion, UTI, Constipation, Epididymitis/Orchitis   Initial Impression  Well appearing, no acute distress Afebrile, normal vitals +Mcburneys sign, not peritonitic  I personally ordered, reviewed, and interpreted all laboratory work and imaging and agree with radiologist interpretation. Results interpreted below:  CBC without leukocytosis, renal function is stable from prior, LFTs are normal, UA  w/o blood and no suggestion of UTI  CT a/p with inflammatory changes within the mesenteric fat interposed between the cecum and appendix. Borderline dilation of appendix (7m) without mucosal  hyper enhancement or appendiceal wall thickening.   Assessment/Plan:  I discussed case with Dr. LBobbye Mortonwith general surgery. She has reviewed CT scan and agrees with interpretation of most likely this being epiploic appendagitis versus colitis. Recommends NSAIDS, liquid diet, and symptom control at home with return precautions  I have explained results to patient. He expresses understanding and is agreeable to plan. F/u with PCP recommended.    Charting Requirements Additional history is obtained from:  Independent historian External Records from outside source obtained and reviewed including: Urgent Care note Social Determinants of Health:  none Pertinant PMH that complicates patient's illness: n/a  Patient Care Problems that were addressed during this visit: - RLQ pain: Acute illness with systemic symptoms Medications given in ED: n/a Reevaluation of the patient after these  medicines showed that the patient stayed the same I have reviewed home medications and made changes accordingly.  Critical Care Interventions: n/a Consultations: general surgery Disposition: discharge  This is a supervised visit with my attending physician, Dr. BNechama Guard We have discussed this patient and they have altered the plan as needed.  Portions of this note were generated with DLobby[employer] Dictation errors may occur despite best attempts at proofreading.   Final Clinical Impression(s) / ED Diagnoses Final diagnoses:  Right lower quadrant abdominal pain    Rx / DC Orders ED Discharge Orders     None         LAdolphus Birchwood PA-C 09/27/22 1256    BElgie Congo MD 09/29/22 [phone]

## 2022-09-27 NOTE — Discharge Instructions (Signed)
You have been seen in the Emergency Department for evaluation of right lower quadrant abdominal pain. The CT we obtained showed "Epiploic appendagitis" which is inflammation of fat that lines the colon. There was no sign of acute appendicitis on the image. We recommend taking tylenol or ibuprofen at home. You can also try a liquid diet as well which may alleviate your symptoms. You should start to see improvement within a few days. If you start to experience worsening symptoms, please return to the emergency department.

## 2022-09-29 ENCOUNTER — Other Ambulatory Visit: Payer: Self-pay | Admitting: Internal Medicine

## 2022-09-29 DIAGNOSIS — G8929 Other chronic pain: Secondary | ICD-10-CM

## 2022-09-30 ENCOUNTER — Ambulatory Visit
Admission: RE | Admit: 2022-09-30 | Discharge: 2022-09-30 | Disposition: A | Discharge status: Home / Self Care | Payer: BC Managed Care – PPO | Source: Ambulatory Visit | Attending: Internal Medicine | Admitting: Internal Medicine

## 2022-09-30 DIAGNOSIS — G8929 Other chronic pain: Secondary | ICD-10-CM

## 2022-09-30 MED ORDER — IOPAMIDOL (ISOVUE-300) INJECTION 61%
100.0000 mL | Freq: Once | INTRAVENOUS | Status: AC | PRN
  Administered 2022-09-30: 100 mL via INTRAVENOUS

## 2022-10-26 ENCOUNTER — Ambulatory Visit: Payer: BC Managed Care – PPO

## 2022-10-26 DIAGNOSIS — I5020 Unspecified systolic (congestive) heart failure: Secondary | ICD-10-CM

## 2022-11-10 ENCOUNTER — Other Ambulatory Visit: Payer: Self-pay | Admitting: Gastroenterology

## 2022-11-10 DIAGNOSIS — R1310 Dysphagia, unspecified: Secondary | ICD-10-CM

## 2022-12-05 ENCOUNTER — Encounter: Payer: BC Managed Care – PPO | Admitting: Cardiology

## 2022-12-05 NOTE — Progress Notes (Deleted)
Patient referred by [name], MD for CAD  Subjective:   Dustin Cook, male    DOB: [birth year], 52 y.o.   MRN: [id number]   No chief complaint on file.    HPI  52 y.o. Caucasian male with  hypertension, hyperlipidemia, CAD s/p MI in 2017, mild aortic root dilatation, HFrEF  Patient has had resolution of dyspnea and leg swelling symptoms. Blood pressure elevated today, but he has had right flank and lower abdominal pain. He has known to have kidney stones in the past. He is going to urgent care for the same.    Current Outpatient Medications:    amLODipine (NORVASC) 10 MG tablet, Take 10 mg by mouth daily., Disp: , Rfl:    aspirin EC 81 MG tablet, Take 81 mg by mouth daily., Disp: , Rfl:    cloNIDine (CATAPRES) 0.2 MG tablet, Take 0.2 mg by mouth at bedtime., Disp: , Rfl:    empagliflozin (JARDIANCE) 10 MG TABS tablet, Take 1 tablet (10 mg total) by mouth daily before breakfast., Disp: 90 tablet, Rfl: 3   hydrALAZINE (APRESOLINE) 25 MG tablet, Take 25 mg by mouth 3 (three) times daily., Disp: , Rfl:    isosorbide mononitrate (IMDUR) 30 MG 24 hr tablet, Take 30 mg by mouth daily. (Patient not taking: Reported on 09/26/2022), Disp: , Rfl:    metoprolol succinate (TOPROL-XL) 100 MG 24 hr tablet, Take 1 tablet (100 mg total) by mouth daily., Disp: 90 tablet, Rfl: 3   nitroGLYCERIN (NITROSTAT) 0.4 MG SL tablet, Place under the tongue., Disp: , Rfl:    rosuvastatin (CRESTOR) 20 MG tablet, Take 20 mg by mouth at bedtime., Disp: , Rfl:    rosuvastatin (CRESTOR) 40 MG tablet, Take 40 mg by mouth daily., Disp: , Rfl:    sacubitril-valsartan (ENTRESTO) 24-26 MG, Take 1 tablet by mouth 2 (two) times daily., Disp: 180 tablet, Rfl: 3  Cardiovascular studies:  Echocardiogram 10/26/2022:   Left ventricle cavity is normal in size. Mild concentric remodeling of the left ventricle. Normal global wall motion. Doppler evidence of grade I (impaired) diastolic dysfunction, normal LAP.  Normal LV systolic function with visual EF 50-55%. Calculated EF 58%. Trileaflet aortic valve. No evidence of aortic stenosis. Mild to moderate aortic regurgitation. Structurally normal tricuspid valve with trace regurgitation. No evidence of pulmonary hypertension. The aortic root is normal. Mildly dilated ascending aorta at 4.4 cm. Compared to the study done on 06/28/2021, previous LVEF of 68%.  Previously ascending aorta was not well-visualized. However, it is significantly improved from 20% in 07/2022.   EKG 08/24/2022: Sinus rhythm 62 bpm Leftward axis Occasional ectopic ventricular beat   Nonspecific T-abnormality  08/08/2022: Findings/PostOp Diagnosis: 1. Mild/moderate CAD with 50% mid LAD stenosis, 30-40% mid RCA  stenosis 2. Biventricular volume overload with reduced cardiac index,  secondary pulmonary venous hypertension  Plan: 1. Start IV milrinone due to reduced cardiac index, increased  filling pressures and rise in creatinine 2. Continued IV lasix. 3. Hold Entresto/Farxiga initiation until f/u BMP 4. Suspect 48-72 hours more of diuresis 5. Will need AHF f/u in [place], [place] (where he lives) due to  low EF 6. Stop IV heparin, change lopressor to Toprol due to low EF  External echocardiogram 08/07/2022: CONCLUSIONS    * Ultrasound enhancing agent is used.    * Left ventricular systolic function is severely reduced with an ejection  fraction of 20 % by visual estimation.    * Global hypokinesis of the left ventricle.    *  Left ventricular chamber size is severely enlarged.    * Moderate concentric left ventricular hypertrophy.    * Left ventricular diastolic function: Grade I diastolic dysfunction.    * Left atrial chamber is normal with a left atrial volume index biplane  method of disk (BP MOD) of 30 ml/m^2.    * Right ventricular systolic function is normal.    * Tricuspid annular plane systolic excursion (TAPSE) is 2.4 cm.  TAPSV  measures 11.3 cm/s.    *  Right ventricular chamber dimension is normal.    * Right atrial chamber size is normal.    * The aortic root at the sinus of Valsalva is dilated measuring 4.20 cm with  an index of 1.7.    * There is mild diffuse thickening (sclerosis) of the aortic valve cusps.    * There is mild aortic valve stenosis with a peak velocity of 2.1 m/s, mean  gradient of 11 mmHg, and aortic valve area of 2.71 cm2.    * There is moderate aortic valve regurgitation.    * No pulmonary hypertension, estimated pulmonary arterial systolic pressure  is 10 mmHg.    * There is no pericardial effusion.    * For the indication of myocardial infarction, findings are Reduced EF 20%  with global HK, aortic root at 4.2 cm with moderaet AI and mild AS.   Recent labs: 09/23/2022: Glucose 116, BUN/Cr 17/1.32. EGFR 66. Na/K 142/4.4.  NT proBNP 245  08/19/2022: Glucose 83, BUN/Cr 22/1.5. EGFR 57. Na/K 139/4.7. H/H 14/48. MCV 87. Platelets 295 Chol 182, TG 245, HDL 47, LDL 98  83, 22/1.5. 57. 139/4.7.  14/43. 87. 295 Chol 182, TG 245, HDL 47, LDL 98   08/07/2022: Glucose 99, BUN/Cr 21/1.4. EGFR 59. Na/K 139/3.7. Rest of the CMP normal H/H 13/40. MCV 86. Platelets 266 Chol 207, TG 134, HDL 45, LDL 135 Trop 130 (0-19) NT proBNP 2116 (<125)   Review of Systems  Constitutional: Negative for malaise/fatigue.  Cardiovascular:  Negative for chest pain, dyspnea on exertion, leg swelling, palpitations and syncope.  Gastrointestinal:  Positive for abdominal pain.         There were no vitals filed for this visit.    There is no height or weight on file to calculate BMI. There were no vitals filed for this visit.    Objective:   Physical Exam Vitals and nursing note reviewed.  Constitutional:      General: He is not in acute distress. Neck:     Vascular: No JVD.  Cardiovascular:     Rate and Rhythm: Normal rate and regular rhythm.     Heart sounds: Normal heart sounds. No murmur heard. Pulmonary:      Effort: Pulmonary effort is normal.     Breath sounds: Normal breath sounds. No wheezing or rales.  Abdominal:     Tenderness: There is abdominal tenderness (RLQ).  Musculoskeletal:     Right lower leg: No edema.     Left lower leg: No edema.           Assessment & Recommendations:   52 y.o. Caucasian male with  hypertension, hyperlipidemia, CAD s/p MI in 2017, mild aortic root dilatation, nonischemic cardiomyopathy, HFrEF  HFrEF: EF 20% in 07/2022, now recovered to 50-55% in 11/2022. Nonischemic cardiomyopathy.Etiology unclear.  Continue Entresto 24-26 mg bid, metoprolol succinate 100 mg daily, Jardiance 10 mg daily. Recommend low salt <2g diet, daily weight checks  CAD:  Nonobstructive. Continue Aspirin, statin, Imdur  Aortic root dilatation: 4.4 cm on echocardiogram in 1/20234 with mod AI, mild AS. Check CTA aorta in 04/2023.  Hypertension: Controlled  F/u in *** months    [name], MD Pager: [phone] Office: [phone]

## 2022-12-28 NOTE — Progress Notes (Unsigned)
Patient referred by [name], MD for CAD  Subjective:   Dustin Cook, male    DOB: [birth year], 52 y.o.   MRN: [id number]   No chief complaint on file.    HPI  52 y.o. Caucasian male with  hypertension, hyperlipidemia, CAD s/p MI in 2017, mild aortic root dilatation, HFrEF  Patient has had resolution of dyspnea and leg swelling symptoms. Blood pressure elevated today, but he has had right flank and lower abdominal pain. He has known to have kidney stones in the past. He is going to urgent care for the same.    Current Outpatient Medications:    amLODipine (NORVASC) 10 MG tablet, Take 10 mg by mouth daily., Disp: , Rfl:    aspirin EC 81 MG tablet, Take 81 mg by mouth daily., Disp: , Rfl:    cloNIDine (CATAPRES) 0.2 MG tablet, Take 0.2 mg by mouth at bedtime., Disp: , Rfl:    empagliflozin (JARDIANCE) 10 MG TABS tablet, Take 1 tablet (10 mg total) by mouth daily before breakfast., Disp: 90 tablet, Rfl: 3   hydrALAZINE (APRESOLINE) 25 MG tablet, Take 25 mg by mouth 3 (three) times daily., Disp: , Rfl:    isosorbide mononitrate (IMDUR) 30 MG 24 hr tablet, Take 30 mg by mouth daily. (Patient not taking: Reported on 09/26/2022), Disp: , Rfl:    metoprolol succinate (TOPROL-XL) 100 MG 24 hr tablet, Take 1 tablet (100 mg total) by mouth daily., Disp: 90 tablet, Rfl: 3   nitroGLYCERIN (NITROSTAT) 0.4 MG SL tablet, Place under the tongue., Disp: , Rfl:    rosuvastatin (CRESTOR) 20 MG tablet, Take 20 mg by mouth at bedtime., Disp: , Rfl:    rosuvastatin (CRESTOR) 40 MG tablet, Take 40 mg by mouth daily., Disp: , Rfl:    sacubitril-valsartan (ENTRESTO) 24-26 MG, Take 1 tablet by mouth 2 (two) times daily., Disp: 180 tablet, Rfl: 3  Cardiovascular studies:  Echocardiogram 10/26/2022:   Left ventricle cavity is normal in size. Mild concentric remodeling of the left ventricle. Normal global wall motion. Doppler evidence of grade I (impaired) diastolic dysfunction, normal LAP.  Normal LV systolic function with visual EF 50-55%. Calculated EF 58%. Trileaflet aortic valve. No evidence of aortic stenosis. Mild to moderate aortic regurgitation. Structurally normal tricuspid valve with trace regurgitation. No evidence of pulmonary hypertension. The aortic root is normal. Mildly dilated ascending aorta at 4.4 cm. Compared to the study done on 06/28/2021, previous LVEF of 68%.  Previously ascending aorta was not well-visualized. However, it is significantly improved from 20% in 07/2022.   EKG 08/24/2022: Sinus rhythm 62 bpm Leftward axis Occasional ectopic ventricular beat   Nonspecific T-abnormality  08/08/2022: Findings/PostOp Diagnosis: 1. Mild/moderate CAD with 50% mid LAD stenosis, 30-40% mid RCA  stenosis 2. Biventricular volume overload with reduced cardiac index,  secondary pulmonary venous hypertension  Plan: 1. Start IV milrinone due to reduced cardiac index, increased  filling pressures and rise in creatinine 2. Continued IV lasix. 3. Hold Entresto/Farxiga initiation until f/u BMP 4. Suspect 48-72 hours more of diuresis 5. Will need AHF f/u in [place], [place] (where he lives) due to  low EF 6. Stop IV heparin, change lopressor to Toprol due to low EF  External echocardiogram 08/07/2022: CONCLUSIONS    * Ultrasound enhancing agent is used.    * Left ventricular systolic function is severely reduced with an ejection  fraction of 20 % by visual estimation.    * Global hypokinesis of the left ventricle.    *  Left ventricular chamber size is severely enlarged.    * Moderate concentric left ventricular hypertrophy.    * Left ventricular diastolic function: Grade I diastolic dysfunction.    * Left atrial chamber is normal with a left atrial volume index biplane  method of disk (BP MOD) of 30 ml/m^2.    * Right ventricular systolic function is normal.    * Tricuspid annular plane systolic excursion (TAPSE) is 2.4 cm.  TAPSV  measures 11.3 cm/s.    *  Right ventricular chamber dimension is normal.    * Right atrial chamber size is normal.    * The aortic root at the sinus of Valsalva is dilated measuring 4.20 cm with  an index of 1.7.    * There is mild diffuse thickening (sclerosis) of the aortic valve cusps.    * There is mild aortic valve stenosis with a peak velocity of 2.1 m/s, mean  gradient of 11 mmHg, and aortic valve area of 2.71 cm2.    * There is moderate aortic valve regurgitation.    * No pulmonary hypertension, estimated pulmonary arterial systolic pressure  is 10 mmHg.    * There is no pericardial effusion.    * For the indication of myocardial infarction, findings are Reduced EF 20%  with global HK, aortic root at 4.2 cm with moderaet AI and mild AS.   Recent labs: 09/23/2022: Glucose 116, BUN/Cr 17/1.32. EGFR 66. Na/K 142/4.4.  NT proBNP 245  08/19/2022: Glucose 83, BUN/Cr 22/1.5. EGFR 57. Na/K 139/4.7. H/H 14/48. MCV 87. Platelets 295 Chol 182, TG 245, HDL 47, LDL 98  83, 22/1.5. 57. 139/4.7.  14/43. 87. 295 Chol 182, TG 245, HDL 47, LDL 98   08/07/2022: Glucose 99, BUN/Cr 21/1.4. EGFR 59. Na/K 139/3.7. Rest of the CMP normal H/H 13/40. MCV 86. Platelets 266 Chol 207, TG 134, HDL 45, LDL 135 Trop 130 (0-19) NT proBNP 2116 (<125)   Review of Systems  Constitutional: Negative for malaise/fatigue.  Cardiovascular:  Negative for chest pain, dyspnea on exertion, leg swelling, palpitations and syncope.  Gastrointestinal:  Positive for abdominal pain.         There were no vitals filed for this visit.    There is no height or weight on file to calculate BMI. There were no vitals filed for this visit.    Objective:   Physical Exam Vitals and nursing note reviewed.  Constitutional:      General: He is not in acute distress. Neck:     Vascular: No JVD.  Cardiovascular:     Rate and Rhythm: Normal rate and regular rhythm.     Heart sounds: Normal heart sounds. No murmur heard. Pulmonary:      Effort: Pulmonary effort is normal.     Breath sounds: Normal breath sounds. No wheezing or rales.  Abdominal:     Tenderness: There is abdominal tenderness (RLQ).  Musculoskeletal:     Right lower leg: No edema.     Left lower leg: No edema.           Assessment & Recommendations:   52 y.o. Caucasian male with  hypertension, hyperlipidemia, CAD s/p MI in 2017, mild aortic root dilatation, nonischemic cardiomyopathy, HFrEF  HFrEF: EF 20% in 07/2022, now recovered to 50-55% in 11/2022. Nonischemic cardiomyopathy.Etiology unclear.  Continue Entresto 24-26 mg bid, metoprolol succinate 100 mg daily, Jardiance 10 mg daily. Recommend low salt <2g diet, daily weight checks  CAD:  Nonobstructive. Continue Aspirin, statin, Imdur  Aortic root dilatation: 4.4 cm on echocardiogram in 1/20234 with mod AI, mild AS. Check CTA aorta in 04/2023.  Hypertension: Controlled  F/u in *** months    [name], MD Pager: [phone] Office: [phone]

## 2022-12-29 ENCOUNTER — Ambulatory Visit: Payer: BC Managed Care – PPO | Admitting: Cardiology

## 2022-12-29 ENCOUNTER — Other Ambulatory Visit: Payer: Self-pay | Admitting: Cardiology

## 2022-12-29 ENCOUNTER — Encounter: Payer: Self-pay | Admitting: Cardiology

## 2022-12-29 VITALS — BP 122/81 | HR 63 | Ht 68.0 in | Wt 279.2 lb

## 2022-12-29 DIAGNOSIS — I77810 Thoracic aortic ectasia: Secondary | ICD-10-CM

## 2022-12-29 DIAGNOSIS — I5020 Unspecified systolic (congestive) heart failure: Secondary | ICD-10-CM

## 2022-12-29 DIAGNOSIS — I10 Essential (primary) hypertension: Secondary | ICD-10-CM

## 2022-12-29 MED ORDER — SPIRONOLACTONE 25 MG PO TABS: 25.0000 mg | ORAL_TABLET | Freq: Every day | ORAL | 3 refills | Status: DC

## 2023-01-27 ENCOUNTER — Ambulatory Visit
Admission: RE | Admit: 2023-01-27 | Discharge: 2023-01-27 | Disposition: A | Discharge status: Home / Self Care | Payer: BC Managed Care – PPO | Source: Ambulatory Visit | Attending: Cardiology | Admitting: Cardiology

## 2023-01-27 DIAGNOSIS — I77810 Thoracic aortic ectasia: Secondary | ICD-10-CM

## 2023-01-27 MED ORDER — IOPAMIDOL (ISOVUE-370) INJECTION 76%
75.0000 mL | Freq: Once | INTRAVENOUS | Status: AC | PRN
  Administered 2023-01-27: 75 mL via INTRAVENOUS

## 2023-06-15 ENCOUNTER — Ambulatory Visit: Payer: BC Managed Care – PPO | Attending: Internal Medicine | Admitting: Physical Therapy

## 2023-06-15 ENCOUNTER — Encounter: Payer: Self-pay | Admitting: Physical Therapy

## 2023-06-15 ENCOUNTER — Other Ambulatory Visit: Payer: Self-pay

## 2023-06-15 VITALS — BP 141/90 | HR 67

## 2023-06-15 DIAGNOSIS — M5459 Other low back pain: Secondary | ICD-10-CM | POA: Insufficient documentation

## 2023-06-15 NOTE — Therapy (Signed)
OUTPATIENT PHYSICAL THERAPY THORACOLUMBAR EVALUATION   Patient Name: Dustin Cook MRN: [id number] DOB:December 04, 1970, 52 y.o., male Today's Date: 06/16/2023  END OF SESSION:  PT End of Session - 06/15/23 1537     Visit Number 1    Number of Visits 5   including eval   Date for PT Re-Evaluation 08/25/23   POC written out given patient's travel schedule and being in town about only 1 x every 3 weeks   Authorization Type [employer]    PT Start Time 1536    PT Stop Time 1620    PT Time Calculation (min) 44 min    Equipment Utilized During Treatment Gait belt    Activity Tolerance Patient tolerated treatment well    Behavior During Therapy WFL for tasks assessed/performed             Past Medical History:  Diagnosis Date   Coronary artery disease    Heart attack (HCC)    HTN (hypertension)    Hyperlipidemia    Liver cyst    Sleep apnea    Past Surgical History:  Procedure Laterality Date   ANGIOPLASTY     CARDIAC CATHETERIZATION     COLONOSCOPY WITH PROPOFOL N/A 03/12/2021   Procedure: COLONOSCOPY WITH PROPOFOL;  Surgeon: Jeani Hawking, MD;  Location: [facility];  Service: Endoscopy;  Laterality: N/A;   CORONARY ANGIOPLASTY     POLYPECTOMY  03/12/2021   Procedure: POLYPECTOMY;  Surgeon: Jeani Hawking, MD;  Location: [facility];  Service: Endoscopy;;   Patient Active Problem List   Diagnosis Date Noted   HFrEF (heart failure with reduced ejection fraction) (HCC) 08/24/2022   Low serum renin 12/29/2020   Left ventricular hypertrophy 07/27/2019   Aortic root dilatation (HCC) 07/27/2019   Heart valve disorder 04/26/2019   Hypertension due to endocrine disorder 04/26/2019   Tobacco abuse 04/26/2019   Mixed hyperlipidemia 04/26/2019   Coronary artery disease involving native coronary artery of native heart without angina pectoris 01/30/2019   Essential hypertension 12/25/2017    PCP: Harvest Forest, MD  REFERRING PROVIDER: Harvest Forest,  MD  REFERRING DIAG: M54.2 (ICD-10-CM) - Cervicalgia  Rationale for Evaluation and Treatment: Rehabilitation  THERAPY DIAG:  Other low back pain - Plan: PT plan of care cert/re-cert  ONSET DATE: 06/14/2023  SUBJECTIVE:                                                                                                                                                                                           SUBJECTIVE STATEMENT: Patient reports that there must be some confusion with the referral as he has  no neck pain but rather low back pain on the L side that radiates down the glutes and over the top of the thigh and shoots down into the knee. Patient reports that he has had some improvement with TENS and icing. Patient reports onset of low back pain is about 12 years but within the past few months the pain has gotten progressively worse. Patient reports the pain is so bad that it keeps him awake at night. Patient reports that if he roles the wrong way at night he wakes up. 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 Patient denies any dramatic weight loss or weight gain. Patient reports no major change in bowel and bladder function. Patient reports that he is fully independent. Patient feels like due to his pain, he is not able to lift weight like he wants to, having a hard time walking dog, etc.   PERTINENT HISTORY:  Aortic atherosclerosis, fatty infiltration of liver, colonic diverticular disease    PAIN:  Are you having pain? Yes: NPRS scale: 6-7/10 Pain location: low back on L side radiating into L glute and anterior thigh into knee Pain description: sharp, shooting, numbness on R anterior thigh Aggravating factors: standing up fully straight Relieving factors: ice and TENS, curling over at the waist  PRECAUTIONS: None  RED FLAGS: None   WEIGHT BEARING  RESTRICTIONS: No  FALLS:  Has patient fallen in last 6 months? 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  PLOF: Independent and ambulating without AD  PATIENT GOALS: "Get out of pain so I can function normally."  NEXT MD VISIT: Next August  OBJECTIVE:   DIAGNOSTIC FINDINGS:  No relevant recent imaging  PATIENT SURVEYS:  Modified Oswestry 20/50 = 40% Impairment   SCREENING FOR RED FLAGS: Bowel or bladder incontinence: No Spinal tumors: No Cauda equina syndrome: No Compression fracture: No Abdominal aneurysm: No  COGNITION: Overall cognitive status: Within functional limits for tasks assessed     SENSATION: Decreased on anterior thigh   POSTURE: rounded shoulders, forward head, and weight shifts on R side due to pain  LUMBAR ROM:   AROM eval  Flexion WFL (reports pain at end range as straightening); does not improve with repeated motions  Extension 40% ROM, sharp pain in low back, does not improve with repeated motions  Right lateral flexion 90% moderate on the L side  Left lateral flexion 60% severe pain in L  Right rotation WFL - mild pain in L knee  Left rotation WFL - mild pain in L knee    (Blank rows = not tested)  LOWER EXTREMITY ROM:     AROM Right eval Left eval  Hip flexion [facility]  Hip extension    Hip abduction    Hip adduction    Hip internal rotation    Hip external rotation    Knee flexion [facility] WFL  Knee extension [facility] WFL   Ankle dorsiflexion 10 `0  Ankle plantarflexion    Ankle inversion    Ankle eversion     (Blank rows = not tested)  LOWER EXTREMITY MMT:     MMT  Right  eval Left eval  Hip flexion 4+/5 4-/5 painful in low back  Hip extension     Hip abduction    Hip adduction    Hip internal rotation    Hip external rotation    Knee flexion 4+/5 4-/5 painful in low back  Knee extension 4+/5 4-/5 painful in low back  Ankle dorsiflexion 5/5 4/5  Ankle plantarflexion    Ankle inversion    Ankle eversion     (Blank rows = not tested)  LUMBAR SPECIAL TESTS:  Slump test: Positive (On Left Side)   FUNCTIONAL TESTS:     06/15/23 0001  Standardized Balance Assessment  Standardized Balance Assessment Five Times Sit to Stand;10 meter walk test  Five times sit to stand comments  30.6 (sec without UE use (limited by pain, no increase from baseline 6/10))   GAIT: Distance walked: antalgic, right lean, forward flexed posture Assistive device utilized: None Level of assistance: SBA  TODAY'S TREATMENT:                                                                                                                                Initial Eval Only  PATIENT EDUCATION:  Education details: POC, goal collaboration, interpretation of examination  Person educated: Patient Education method: Explanation Education comprehension: verbalized understanding and needs further education  HOME EXERCISE PROGRAM: To be provided  ASSESSMENT:  CLINICAL IMPRESSION: Patient is a 52 y.o. male who was seen today for physical therapy evaluation and treatment for low back pain. Patient presents with flexion bias in today's session. Patient has positive slump test and dermatome and myotome changes along L3-4 pattern. Patient demonstrates significant functional LE weakness likely due to pain as well on 5xSTS, performing well below age matched norms. Patient will benefit from skilled physical therapy services in order to manage pain, maximize function, and progress towards PLOF.   OBJECTIVE IMPAIRMENTS: Abnormal gait, decreased activity tolerance, difficulty walking, decreased ROM, decreased strength, impaired sensation, and pain.   ACTIVITY LIMITATIONS:  carrying, lifting, bending, standing, squatting, stairs, transfers, and locomotion level  PARTICIPATION LIMITATIONS: meal prep, laundry, community activity, occupation, and yard work  PERSONAL FACTORS: Profession, Time since onset of injury/illness/exacerbation, and 1-2 comorbidities: see above  are also affecting patient's functional outcome.   REHAB POTENTIAL: Fair Limited by patient traveling so frequently for work and limited scheduling availability  CLINICAL DECISION MAKING: Stable/uncomplicated  EVALUATION COMPLEXITY: Low   GOALS: Goals reviewed with patient? Yes  SHORT TERM GOALS: Target date: 07/14/2023  Patient will demonstrate 100% compliance with initial HEP to continue to progress between physical therapy sessions.   Baseline: Initial to be provided Goal status: INITIAL  2.  Patient will improve 5x Sit to Stand score to < 25 to demonstrate a minimal clinically important difference towards a decreased  risk for falls and improved lower extremity strength.   Baseline: 30.6 seconds without UE support Goal status: INITIAL  LONG TERM GOALS: Target date: 08/25/2023  Patient will report demonstrate independence with final HEP in order to maintain current gains and continue to progress after physical therapy discharge.   Baseline: Initial to be provided Goal status: INITIAL  2.  Patient will improve their 5x Sit to Stand score to less than 20 seconds with pain </= 5/10 to demonstrate a decreased risk for falls and improved LE strength.   Baseline: 30.6 seconds without UE support Goal status: INITIAL  3.  Patient will improve modified ODI score to 30% impairment or less indicate improvement in low back pain.   Baseline: 40% Goal status: INITIAL  PLAN:  PT FREQUENCY: 1x - every 1-3 weeks pending work schedule  PT DURATION: 10 weeks  PLANNED INTERVENTIONS: Therapeutic exercises, Therapeutic activity, Neuromuscular re-education, Balance training, Gait training,  Patient/Family education, Self Care, Joint mobilization, Aquatic Therapy, Dry Needling, Manual therapy, and Re-evaluation.  PLAN FOR NEXT SESSION: create initial HEP with flexion based HEP, proximal stability, assess knee as able  Carmelia Bake, PT, DPT 06/16/2023, 12:52 PM

## 2023-06-16 ENCOUNTER — Encounter: Payer: Self-pay | Admitting: Physical Therapy

## 2023-06-29 ENCOUNTER — Ambulatory Visit: Payer: BC Managed Care – PPO | Admitting: Cardiology

## 2023-06-29 ENCOUNTER — Encounter: Payer: Self-pay | Admitting: Cardiology

## 2023-06-29 VITALS — BP 134/86 | HR 55 | Resp 16 | Ht 68.0 in | Wt 275.0 lb

## 2023-06-29 DIAGNOSIS — I351 Nonrheumatic aortic (valve) insufficiency: Secondary | ICD-10-CM

## 2023-06-29 DIAGNOSIS — I10 Essential (primary) hypertension: Secondary | ICD-10-CM

## 2023-06-29 DIAGNOSIS — I77810 Thoracic aortic ectasia: Secondary | ICD-10-CM

## 2023-06-29 DIAGNOSIS — I5020 Unspecified systolic (congestive) heart failure: Secondary | ICD-10-CM

## 2023-06-29 DIAGNOSIS — I7121 Aneurysm of the ascending aorta, without rupture: Secondary | ICD-10-CM | POA: Insufficient documentation

## 2023-06-29 MED ORDER — SPIRONOLACTONE 25 MG PO TABS: 25.0000 mg | ORAL_TABLET | Freq: Every day | ORAL | 3 refills | Status: DC

## 2023-06-29 MED ORDER — ENTRESTO 24-26 MG PO TABS: 1.0000 | ORAL_TABLET | Freq: Two times a day (BID) | ORAL | 3 refills | Status: AC

## 2023-06-29 MED ORDER — ASPIRIN 81 MG PO TBEC: 81.0000 mg | DELAYED_RELEASE_TABLET | Freq: Every day | ORAL | 3 refills | Status: AC

## 2023-06-29 MED ORDER — METOPROLOL SUCCINATE ER 100 MG PO TB24: 100.0000 mg | ORAL_TABLET | Freq: Every day | ORAL | 3 refills | Status: DC

## 2023-06-29 MED ORDER — AMLODIPINE BESYLATE 10 MG PO TABS: 10.0000 mg | ORAL_TABLET | Freq: Every day | ORAL | 3 refills | Status: DC

## 2023-06-29 MED ORDER — ROSUVASTATIN CALCIUM 40 MG PO TABS: 40.0000 mg | ORAL_TABLET | Freq: Every day | ORAL | 3 refills | Status: DC

## 2023-06-29 MED ORDER — EMPAGLIFLOZIN 10 MG PO TABS: 10.0000 mg | ORAL_TABLET | Freq: Every day | ORAL | 3 refills | Status: DC

## 2023-06-29 NOTE — Progress Notes (Signed)
Patient referred by Harvest Forest, MD for CAD  Subjective:   Dustin Cook, male    DOB: [birth year], 52 y.o.   MRN: [id number]   Chief Complaint  Patient presents with   HFrEF   Follow-up    6 month     HPI  52 y.o. Caucasian male with  hypertension, hyperlipidemia, CAD s/p MI in 2017, mild aortic root dilatation, HFrEF  Patient is doing well. He denies chest pain, shortness of breath, palpitations, leg edema, orthopnea, PND, TIA/syncope. Reviewed recent test results with the patient, details below.    Current Outpatient Medications:    amLODipine (NORVASC) 10 MG tablet, Take 10 mg by mouth daily., Disp: , Rfl:    aspirin EC 81 MG tablet, Take 81 mg by mouth daily., Disp: , Rfl:    cloNIDine (CATAPRES) 0.2 MG tablet, Take 0.2 mg by mouth as directed. Take 1/2 tab daily for 1 week, then 1/2 tab daily every other day, then stop, Disp: , Rfl:    empagliflozin (JARDIANCE) 10 MG TABS tablet, Take 1 tablet (10 mg total) by mouth daily before breakfast., Disp: 90 tablet, Rfl: 3   furosemide (LASIX) 40 MG tablet, Take 40 mg by mouth daily as needed., Disp: , Rfl:    hydrALAZINE (APRESOLINE) 25 MG tablet, Take 25 mg by mouth 3 (three) times daily as needed., Disp: , Rfl:    metoprolol succinate (TOPROL-XL) 100 MG 24 hr tablet, Take 1 tablet (100 mg total) by mouth daily., Disp: 90 tablet, Rfl: 3   nitroGLYCERIN (NITROSTAT) 0.4 MG SL tablet, Place under the tongue., Disp: , Rfl:    rosuvastatin (CRESTOR) 40 MG tablet, Take 40 mg by mouth daily., Disp: , Rfl:    sacubitril-valsartan (ENTRESTO) 24-26 MG, Take 1 tablet by mouth 2 (two) times daily., Disp: 180 tablet, Rfl: 3   spironolactone (ALDACTONE) 25 MG tablet, Take 1 tablet (25 mg total) by mouth daily., Disp: 90 tablet, Rfl: 3  Cardiovascular studies:  EKG 06/29/2023: Sinus rhythm 57 bpm Nonspecific T-abnormality  CTA aorta 01/26/2022: 4.4 cm fusiform ectatic dilatation of ascending thoracic aorta. Continue annual  imaging followup by CTA or MRA.  This recommendation follows 2010 ACCF/AHA/AATS/ACR/ASA/SCA/SCAI/SIR/STS/SVM Guidelines for the Diagnosis and Management of Patients with Thoracic Aortic Disease. Circulation. 2010; 121: N829-F621. Aortic aneurysm NOS (ICD10-I71.9) Sub-1 cm incidental LEFT thyroid nodule. No follow-up imaging is recommended.  Echocardiogram 10/26/2022:   Left ventricle cavity is normal in size. Mild concentric remodeling of the left ventricle. Normal global wall motion. Doppler evidence of grade I (impaired) diastolic dysfunction, normal LAP. Normal LV systolic function with visual EF 50-55%. Calculated EF 58%. Trileaflet aortic valve. No evidence of aortic stenosis. Mild to moderate aortic regurgitation. Structurally normal tricuspid valve with trace regurgitation. No evidence of pulmonary hypertension. The aortic root is normal. Mildly dilated ascending aorta at 4.4 cm. Compared to the study done on 06/28/2021, previous LVEF of 68%.  Previously ascending aorta was not well-visualized. However, it is significantly improved from 20% in 07/2022.  Coronary aniography 08/08/2022 (External): Findings/PostOp Diagnosis: 1. Mild/moderate CAD with 50% mid LAD stenosis, 30-40% mid RCA  stenosis 2. Biventricular volume overload with reduced cardiac index,  secondary pulmonary venous hypertension   Recent labs: 09/23/2022: Glucose 116, BUN/Cr 17/1.32. EGFR 66. Na/K 142/4.4.  NT proBNP 245  08/19/2022: Glucose 83, BUN/Cr 22/1.5. EGFR 57. Na/K 139/4.7. H/H 14/48. MCV 87. Platelets 295 Chol 182, TG 245, HDL 47, LDL 98  83, 22/1.5. 57. 139/4.7.  14/43. 87. 295  Chol 182, TG 245, HDL 47, LDL 98   08/07/2022: Glucose 99, BUN/Cr 21/1.4. EGFR 59. Na/K 139/3.7. Rest of the CMP normal H/H 13/40. MCV 86. Platelets 266 Chol 207, TG 134, HDL 45, LDL 135 Trop 130 (6-57) NT proBNP 2116 (<125)   Review of Systems  Constitutional: Negative for malaise/fatigue.  Cardiovascular:   Negative for chest pain, dyspnea on exertion, leg swelling, palpitations and syncope.  Gastrointestinal:  Positive for abdominal pain.         Vitals:   06/29/23 1300  BP: 134/86  Pulse: (!) 55  Resp: 16      Body mass index is 41.81 kg/m. Filed Weights   06/29/23 1300  Weight: 275 lb (124.7 kg)      Objective:   Physical Exam Vitals and nursing note reviewed.  Constitutional:      General: He is not in acute distress. Neck:     Vascular: No JVD.  Cardiovascular:     Rate and Rhythm: Normal rate and regular rhythm.     Heart sounds: Normal heart sounds. No murmur heard. Pulmonary:     Effort: Pulmonary effort is normal.     Breath sounds: Normal breath sounds. No wheezing or rales.  Musculoskeletal:     Right lower leg: No edema.     Left lower leg: No edema.           Assessment & Recommendations:   52 y.o. Caucasian male with  hypertension, hyperlipidemia, CAD s/p MI in 2017, mild aortic root dilatation, nonischemic cardiomyopathy, HFrEF  HFrEF: EF 20% in 07/2022, now recovered to 50-55% in 11/2022. Nonischemic cardiomyopathy.Etiology unclear.  Continue Entresto 24-26 mg bid, metoprolol succinate 100 mg daily, Jardiance 10 mg daily., spironolactone 25 mg daily. Will get recent labs from PCP/ Recommend low salt <2g diet, daily weight checks  CAD:  Nonobstructive. Continue Aspirin, statin.  Aortic root dilatation, mod AI: 4.4 cm on CTA (01/2023) and echocardiogram (11/2021) with mod AI, mild AS. Continue HR BP control. Repeat CTA and echocardiogram in 01/2024.  Hypertension: Controlled.  F/u in 02/2024    Elder Negus, MD Pager: [phone] Office: [phone]

## 2023-07-07 ENCOUNTER — Ambulatory Visit: Payer: BC Managed Care – PPO | Admitting: Physical Therapy

## 2023-07-10 ENCOUNTER — Ambulatory Visit: Payer: BC Managed Care – PPO | Admitting: Physical Therapy

## 2023-07-10 ENCOUNTER — Telehealth: Payer: Self-pay | Admitting: Physical Therapy

## 2023-07-10 NOTE — Telephone Encounter (Signed)
Called patient; patient out of town and forgot appointment. Given travel schedule will likely on plan for one more visit and possible D/C with updated HEP given inability for consistency at this time. Will plan to add patient to waitlist for Friday.   Maryruth Eve, PT, DPT

## 2023-07-20 NOTE — Progress Notes (Signed)
This encounter was created in error - please disregard.

## 2023-07-28 ENCOUNTER — Encounter: Payer: Self-pay | Admitting: Physical Therapy

## 2023-07-28 ENCOUNTER — Ambulatory Visit: Payer: BC Managed Care – PPO | Attending: Internal Medicine | Admitting: Physical Therapy

## 2023-07-28 NOTE — Therapy (Signed)
Therapist called patient and LVM. Patient informed why D/C from PT due to lack of return since eval and no show policy. Educated how to return to PT.  PHYSICAL THERAPY DISCHARGE SUMMARY  Visits from Start of Care: 1  Current functional level related to goals / functional outcomes: Unable to assess; did not return since eval   Remaining deficits: Unable to assess; did not return since eval   Education / Equipment: How to return to PT if needed with new referral   Patient agrees to discharge. Patient goals were not met. Patient is being discharged due to not returning since the last visit.

## 2024-01-05 ENCOUNTER — Other Ambulatory Visit: Payer: BC Managed Care – PPO

## 2024-01-05 ENCOUNTER — Ambulatory Visit (HOSPITAL_COMMUNITY): Payer: BC Managed Care – PPO | Attending: Internal Medicine

## 2024-01-05 ENCOUNTER — Other Ambulatory Visit (HOSPITAL_COMMUNITY): Payer: BC Managed Care – PPO

## 2024-03-15 ENCOUNTER — Ambulatory Visit: Payer: Self-pay | Admitting: Cardiology

## 2024-06-18 ENCOUNTER — Other Ambulatory Visit: Payer: Self-pay | Admitting: Cardiology

## 2024-06-22 ENCOUNTER — Other Ambulatory Visit: Payer: Self-pay | Admitting: Cardiology

## 2024-09-04 ENCOUNTER — Other Ambulatory Visit: Payer: Self-pay

## 2024-09-04 DIAGNOSIS — I5020 Unspecified systolic (congestive) heart failure: Secondary | ICD-10-CM

## 2024-09-06 ENCOUNTER — Other Ambulatory Visit: Payer: Self-pay | Admitting: Cardiology

## 2024-09-06 MED ORDER — EMPAGLIFLOZIN 10 MG PO TABS: 10.0000 mg | ORAL_TABLET | Freq: Every day | ORAL | 0 refills | Status: AC

## 2024-09-06 MED ORDER — AMLODIPINE BESYLATE 10 MG PO TABS: 10.0000 mg | ORAL_TABLET | Freq: Every day | ORAL | 0 refills | Status: AC

## 2024-09-14 ENCOUNTER — Other Ambulatory Visit: Payer: Self-pay | Admitting: Cardiology

## 2024-09-16 ENCOUNTER — Other Ambulatory Visit: Payer: Self-pay | Admitting: Cardiology

## 2024-09-17 ENCOUNTER — Other Ambulatory Visit: Payer: Self-pay | Admitting: Cardiology

## 2024-09-19 MED ORDER — ROSUVASTATIN CALCIUM 40 MG PO TABS: 40.0000 mg | ORAL_TABLET | Freq: Every day | ORAL | 0 refills | Status: DC

## 2024-09-19 MED ORDER — METOPROLOL SUCCINATE ER 100 MG PO TB24: 100.0000 mg | ORAL_TABLET | Freq: Every day | ORAL | 0 refills | Status: AC

## 2024-11-06 ENCOUNTER — Other Ambulatory Visit: Payer: Self-pay | Admitting: Cardiology

## 2024-12-11 ENCOUNTER — Other Ambulatory Visit: Payer: Self-pay | Admitting: Internal Medicine

## 2024-12-11 DIAGNOSIS — Z122 Encounter for screening for malignant neoplasm of respiratory organs: Secondary | ICD-10-CM

## 2024-12-27 ENCOUNTER — Other Ambulatory Visit
